# Patient Record
Sex: Male | Born: 1965 | Race: White | Hispanic: No | Marital: Married | Smoking: Never smoker
Health system: Southern US, Community
[De-identification: ages and names within clinical notes are randomized; demographics above are authoritative.]

## PROBLEM LIST (undated history)

## (undated) ENCOUNTER — Emergency Department: Admission: EM | Payer: Self-pay

## (undated) DIAGNOSIS — N411 Chronic prostatitis: Secondary | ICD-10-CM

## (undated) DIAGNOSIS — I10 Essential (primary) hypertension: Secondary | ICD-10-CM

## (undated) DIAGNOSIS — E039 Hypothyroidism, unspecified: Secondary | ICD-10-CM

## (undated) DIAGNOSIS — E781 Pure hyperglyceridemia: Secondary | ICD-10-CM

## (undated) DIAGNOSIS — E291 Testicular hypofunction: Secondary | ICD-10-CM

## (undated) HISTORY — DX: Chronic prostatitis: N41.1

## (undated) HISTORY — PX: APPENDECTOMY: SHX54

## (undated) HISTORY — DX: Pure hyperglyceridemia: E78.1

## (undated) HISTORY — PX: SHOULDER ARTHROSCOPY: SHX128

## (undated) HISTORY — DX: Essential (primary) hypertension: I10

## (undated) HISTORY — DX: Testicular hypofunction: E29.1

## (undated) HISTORY — DX: Hypothyroidism, unspecified: E03.9

## (undated) HISTORY — PX: TENDON REPAIR: SHX5111

---

## 2009-03-18 ENCOUNTER — Emergency Department (HOSPITAL_BASED_OUTPATIENT_CLINIC_OR_DEPARTMENT_OTHER): Admission: EM | Admit: 2009-03-18 | Discharge: 2009-03-18 | Payer: Self-pay | Admitting: Emergency Medicine

## 2009-03-18 ENCOUNTER — Ambulatory Visit: Payer: Self-pay | Admitting: Interventional Radiology

## 2010-02-27 ENCOUNTER — Ambulatory Visit: Payer: Self-pay | Admitting: Family Medicine

## 2010-03-01 ENCOUNTER — Emergency Department (HOSPITAL_BASED_OUTPATIENT_CLINIC_OR_DEPARTMENT_OTHER): Admission: EM | Admit: 2010-03-01 | Discharge: 2010-03-01 | Payer: Self-pay | Admitting: Emergency Medicine

## 2010-03-08 ENCOUNTER — Ambulatory Visit (HOSPITAL_COMMUNITY): Admission: RE | Admit: 2010-03-08 | Discharge: 2010-03-09 | Payer: Self-pay | Admitting: Orthopedic Surgery

## 2010-10-18 NOTE — Assessment & Plan Note (Signed)
Summary: NASAL INFEC?/TM   Vital Signs:  Patient Profile:   45 Years Old Male CC:      Infection in nose x 3 days, left ankle sprain x 1 week Height:     72 inches Weight:      210 pounds O2 Sat:      99 % O2 treatment:    Room Air Temp:     98.7 degrees F oral Pulse rate:   110 / minute Pulse rhythm:   regular Resp:     16 per minute BP sitting:   155 / 94  (right arm) Cuff size:   large  Vitals Entered By: Emilio Math (February 27, 2010 2:20 PM)                  Current Allergies: ! DEMEROLHistory of Present Illness Chief Complaint: Infection in nose x 3 days, left ankle sprain x 1 week History of Present Illness: Subjective:  Patient complains of 3 day history of swelling, tenderness, and redness at tip of nose.  He states that he gets about one infection in his nose per year, and this one is somewhat worse than usual.  No facial swelling or fever. He also has a history of seasonal allergies, and over the past two or three weeks has developed persistent sinus congestion.  Ears feel clogged but not painful.  No sore throat. He also reports that he experienced a mild left ankle sprain about a week ago and has persistent mild lateral ankle tenderness but declines evaluation for this problem.  He wonders how long it takes for an ankle sprain to heal.  REVIEW OF SYSTEMS Constitutional Symptoms      Denies fever, chills, night sweats, weight loss, weight gain, and fatigue.  Eyes       Denies change in vision, eye pain, eye discharge, glasses, contact lenses, and eye surgery. Ear/Nose/Throat/Mouth       Denies hearing loss/aids, change in hearing, ear pain, ear discharge, dizziness, frequent runny nose, frequent nose bleeds, sinus problems, sore throat, hoarseness, and tooth pain or bleeding.  Respiratory       Denies dry cough, productive cough, wheezing, shortness of breath, asthma, bronchitis, and emphysema/COPD.  Cardiovascular       Denies murmurs, chest pain, and tires  easily with exhertion.    Gastrointestinal       Denies stomach pain, nausea/vomiting, diarrhea, constipation, blood in bowel movements, and indigestion. Genitourniary       Denies painful urination, kidney stones, and loss of urinary control. Neurological       Denies paralysis, seizures, and fainting/blackouts. Musculoskeletal       Denies muscle pain, joint pain, joint stiffness, decreased range of motion, redness, swelling, muscle weakness, and gout.  Skin       Denies bruising, unusual mles/lumps or sores, and hair/skin or nail changes.  Psych       Denies mood changes, temper/anger issues, anxiety/stress, speech problems, depression, and sleep problems.  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  Extraocular movement is intact.  Conjunctivae are not inflamed.  Ears:  Canals normal.  Tympanic membranes normal.   Nose:  Erythematous distally and mildly tender but not  swollen.  Turbinates are mildly congested.  There is mild maxillary sinus tenderness Pharynx:  Normal  Neck:  Supple.  No adenopathy is present.  No thyromegaly is present  Assessment New Problems: ANKLE SPRAIN, LEFT (ICD-845.00) ACUTE MAXILLARY SINUSITIS (ICD-461.0) CELLULITIS AND ABSCESS OF FACE (ICD-682.0)   Plan New Medications/Changes: FLONASE 50 MCG/ACT SUSP (FLUTICASONE PROPIONATE) 2 sprays in each nostril once daily.  #1 x 1, 02/27/2010, Donna Christen MD FLUTICASONE PROPIONATE 50 MCG/ACT SUSP (FLUTICASONE PROPIONATE) 2 sprays in each nostril once daily  #One x 1, 02/27/2010, Donna Christen MD MUPIROCIN 2 % OINT (MUPIROCIN) Apply to nares three times a day  #1 tube x 0,  02/27/2010, Donna Christen MD SULFAMETHOXAZOLE-TMP DS 800-160 MG TABS (SULFAMETHOXAZOLE-TRIMETHOPRIM) Two by mouth two times a day  #40 x 0, 02/27/2010, Donna Christen MD  New Orders: New Patient Level III [phone] Planning Comments:   Begin warm compresses to nose several times daily.  Begin Bactrim DS, two tabs bid.   Begin expectorant/decongestant.  Apply Bactroban to nares two or three times daily. Wear ankle brace for 2 to 3 weeks until ankle healed.  Begin ankle exercises ([facility] information and instruction patient handout given)  Follow-up with ENT if nose not improved about 48 hours.   The patient and/or caregiver has been counseled thoroughly with regard to medications prescribed including dosage, schedule, interactions, rationale for use, and possible side effects and they verbalize understanding.  Diagnoses and expected course of recovery discussed and will return if not improved as expected or if the condition worsens. Patient and/or caregiver verbalized understanding.  Prescriptions: FLONASE 50 MCG/ACT SUSP (FLUTICASONE PROPIONATE) 2 sprays in each nostril once daily.  #1 x 1   Entered and Authorized by:   Donna Christen MD   Signed by:   Donna Christen MD on 02/27/2010   Method used:   Print then Give to Patient   RxID:   [id number] FLUTICASONE PROPIONATE 50 MCG/ACT SUSP (FLUTICASONE PROPIONATE) 2 sprays in each nostril once daily  #One x 1   Entered and Authorized by:   Donna Christen MD   Signed by:   Donna Christen MD on 02/27/2010   Method used:   Print then Give to Patient   RxID:   [id number] MUPIROCIN 2 % OINT (MUPIROCIN) Apply to nares three times a day  #1 tube x 0   Entered and Authorized by:   Donna Christen MD   Signed by:   Donna Christen MD on 02/27/2010   Method used:   Print then Give to Patient   RxID:   [id number] SULFAMETHOXAZOLE-TMP DS 800-160 MG TABS (SULFAMETHOXAZOLE-TRIMETHOPRIM) Two by mouth two times a day  #40 x 0   Entered and  Authorized by:   Donna Christen MD   Signed by:   Donna Christen MD on 02/27/2010   Method used:   Print then Give to Patient   RxID:   [id number]   Patient Instructions: 1)  May use Mucinex D (guaifenesin with decongestant) twice daily for congestion. 2)  Increase fluid intake, rest. 3)  May use Afrin nasal spray (or generic oxymetazoline) twice daily for about 5 days (about 15 minutes before using fluticasone spray).   Also recommend using saline nasal spray several times daily and/or saline nasal irrigation. 4)  Apply warm compresses to nose 3 or 4 times daily. 5)  Followup with ENT doctor if not improved 2 to 3 days.  Orders Added: 1)  New Patient Level III [63875]

## 2010-12-04 LAB — DIFFERENTIAL
Basophils Absolute: 0 10*3/uL (ref 0.0–0.1)
Basophils Relative: 0 % (ref 0–1)
Eosinophils Relative: 1 % (ref 0–5)
Monocytes Absolute: 0.4 10*3/uL (ref 0.1–1.0)
Neutro Abs: 4.5 10*3/uL (ref 1.7–7.7)

## 2010-12-04 LAB — BASIC METABOLIC PANEL
BUN: 14 mg/dL (ref 6–23)
CO2: 27 mEq/L (ref 19–32)
Calcium: 9.3 mg/dL (ref 8.4–10.5)
Glucose, Bld: 90 mg/dL (ref 70–99)
Sodium: 139 mEq/L (ref 135–145)

## 2010-12-04 LAB — URINALYSIS, ROUTINE W REFLEX MICROSCOPIC
Bilirubin Urine: NEGATIVE
Glucose, UA: NEGATIVE mg/dL
Ketones, ur: NEGATIVE mg/dL
pH: 6 (ref 5.0–8.0)

## 2010-12-04 LAB — CBC
HCT: 41.8 % (ref 39.0–52.0)
Hemoglobin: 14.7 g/dL (ref 13.0–17.0)
MCHC: 35.2 g/dL (ref 30.0–36.0)
Platelets: 230 10*3/uL (ref 150–400)
RDW: 11.5 % (ref 11.5–15.5)

## 2010-12-04 LAB — PROTIME-INR: Prothrombin Time: 12.2 seconds (ref 11.6–15.2)

## 2014-08-04 ENCOUNTER — Encounter: Payer: Self-pay | Admitting: Family Medicine

## 2014-08-04 ENCOUNTER — Ambulatory Visit (INDEPENDENT_AMBULATORY_CARE_PROVIDER_SITE_OTHER): Payer: BC Managed Care – PPO | Admitting: Family Medicine

## 2014-08-04 VITALS — BP 136/90 | HR 78 | Ht 72.0 in | Wt 223.0 lb

## 2014-08-04 DIAGNOSIS — E781 Pure hyperglyceridemia: Secondary | ICD-10-CM

## 2014-08-04 DIAGNOSIS — E291 Testicular hypofunction: Secondary | ICD-10-CM | POA: Diagnosis not present

## 2014-08-04 DIAGNOSIS — J329 Chronic sinusitis, unspecified: Secondary | ICD-10-CM

## 2014-08-04 DIAGNOSIS — A499 Bacterial infection, unspecified: Secondary | ICD-10-CM

## 2014-08-04 DIAGNOSIS — E038 Other specified hypothyroidism: Secondary | ICD-10-CM | POA: Diagnosis not present

## 2014-08-04 DIAGNOSIS — N410 Acute prostatitis: Secondary | ICD-10-CM

## 2014-08-04 DIAGNOSIS — E0590 Thyrotoxicosis, unspecified without thyrotoxic crisis or storm: Secondary | ICD-10-CM | POA: Insufficient documentation

## 2014-08-04 DIAGNOSIS — R8299 Other abnormal findings in urine: Secondary | ICD-10-CM | POA: Diagnosis not present

## 2014-08-04 DIAGNOSIS — B9689 Other specified bacterial agents as the cause of diseases classified elsewhere: Secondary | ICD-10-CM

## 2014-08-04 DIAGNOSIS — E039 Hypothyroidism, unspecified: Secondary | ICD-10-CM

## 2014-08-04 DIAGNOSIS — R8290 Unspecified abnormal findings in urine: Secondary | ICD-10-CM

## 2014-08-04 DIAGNOSIS — N411 Chronic prostatitis: Secondary | ICD-10-CM

## 2014-08-04 DIAGNOSIS — R7989 Other specified abnormal findings of blood chemistry: Secondary | ICD-10-CM | POA: Insufficient documentation

## 2014-08-04 HISTORY — DX: Hypothyroidism, unspecified: E03.9

## 2014-08-04 HISTORY — DX: Testicular hypofunction: E29.1

## 2014-08-04 HISTORY — DX: Pure hyperglyceridemia: E78.1

## 2014-08-04 HISTORY — DX: Chronic prostatitis: N41.1

## 2014-08-04 LAB — POCT URINALYSIS DIPSTICK
Bilirubin, UA: NEGATIVE
Glucose, UA: NEGATIVE
Ketones, UA: NEGATIVE
NITRITE UA: NEGATIVE
PH UA: 6.5
SPEC GRAV UA: 1.025
UROBILINOGEN UA: 0.2

## 2014-08-04 MED ORDER — SULFAMETHOXAZOLE-TRIMETHOPRIM 800-160 MG PO TABS: ORAL_TABLET | ORAL | Status: AC

## 2014-08-04 MED ORDER — TRIAMCINOLONE ACETONIDE 55 MCG/ACT NA AERO: 2.0000 | INHALATION_SPRAY | Freq: Every day | NASAL | Status: DC

## 2014-08-04 NOTE — Progress Notes (Signed)
CC: Everrett Coombehillip B Salameh is a 48 y.o. male is here for Establish Care; Cough; and f/u UTI   Subjective: HPI:  Very pleasant 48 year old here to establish care  Patient complains of mild dysuria and cloudy appearance to the urine. This has been present since September and was moderate in severity prior to beginning 2 weeks of ciprofloxacin. Before starting this medication he had a urine culture that showed a beta-hemolytic strep infection. He reports a Cipro has helped a little bit but not fully resolved his symptoms. Since being on Cipro he reports diffuse tendon discomfort that has been improving since stopping Cipro. There has been no flank pain or visual blood in his urine.he tells me he has a history of chronic prostatitis stemming back into his teenage years and is currently seeing urology for management of this  Reports a history of hypertriglyceridemia most recently checked over a year ago with a level of 600. He is not currently taking any medication to help with this.  Complains ofnasal congestion and postnasal drip with Rusher in the face localized beneath both eyes that has been present for a little over 1 week on a daily basis worsening on a daily basis now moderate in severity. For the past week he's tried Nasacort without much benefit. He's also tried Allegra-D however this is causing urinary retention without much benefit for the above complaints in this paragraph.  He reports that in 2013 he had a TSH of 5.3 that was never followed up on.  He reports a history of hypogonadism with a testosterone that was checked about a year ago that was 300.  Review of Systems - General ROS: negative for - chills, fever, night sweats, weight gain or weight loss Ophthalmic ROS: negative for - decreased vision Psychological ROS: negative for - anxiety or depression ENT ROS: negative for - hearing change, tinnitus or allergies Hematological and Lymphatic ROS: negative for - bleeding problems, bruising  or swollen lymph nodes Breast ROS: negative Respiratory ROS: no cough, shortness of breath, or wheezing Cardiovascular ROS: no chest pain or dyspnea on exertion Gastrointestinal ROS: no abdominal pain, change in bowel habits, or black or bloody stools Genito-Urinary ROS: negative for - genital discharge, genital ulcers, incontinence or abnormal bleeding from genitals Musculoskeletal ROS: negative for - joint pain or muscle pain other than that described above Neurological ROS: negative for - headaches or memory loss Dermatological ROS: negative for lumps, mole changes, rash and skin lesion changes  Past Medical History  Diagnosis Date  . Hypertriglyceridemia 08/04/2014  . Hypogonadism in male 08/04/2014  . Hypothyroidism 08/04/2014  . Chronic prostatitis 08/04/2014    No past surgical history on file. No family history on file.  History   Social History  . Marital Status: Married    Spouse Name: N/A    Number of Children: N/A  . Years of Education: N/A   Occupational History  . Not on file.   Social History Main Topics  . Smoking status: Not on file  . Smokeless tobacco: Not on file  . Alcohol Use: Not on file  . Drug Use: Not on file  . Sexual Activity: Not on file   Other Topics Concern  . Not on file   Social History Narrative  . No narrative on file     Objective: BP 136/90 mmHg  Pulse 78  Ht 6' (1.829 m)  Wt 223 lb (101.152 kg)  BMI 30.24 kg/m2  General: Alert and Oriented, No Acute Distress HEENT: Pupils  equal, round, reactive to light. Conjunctivae clear.  External ears unremarkable, canals clear with intact TMs with appropriate landmarks.  Middle ear appears open without effusion. Pink inferior turbinates.  Moist mucous membranes, pharynx without inflammation nor lesions however moderate postnasal drip.  Neck supple without palpable lymphadenopathy nor abnormal masses. Lungs: Clear to auscultation bilaterally, no wheezing/ronchi/rales.  Comfortable work of  breathing. Good air movement. Cardiac: Regular rate and rhythm. Normal S1/S2.  No murmurs, rubs, nor gallops.   Extremities: No peripheral edema.  Strong peripheral pulses.  Mental Status: No depression, anxiety, nor agitation. Skin: Warm and dry.  Assessment & Plan: [name] was seen today for establish care, cough and f/u uti.  Diagnoses and associated orders for this visit:  Cloudy urine - Urinalysis Dipstick  Hypertriglyceridemia  Other specified hypothyroidism  Hypogonadism in male  Bacterial sinusitis - triamcinolone (NASACORT AQ) 55 MCG/ACT AERO nasal inhaler; Place 2 sprays into the nose daily.  Acute bacterial prostatitis - sulfamethoxazole-trimethoprim (SEPTRA DS) 800-160 MG per tablet; One by mouth twice a day for four weeks. - Urine culture  Chronic prostatitis    Hypertriglyceridemia: Discussed that we will have this addressed when he returns for a complete physical exam and fasting lipid panel Hypergonadism: Will recheck testosterone at his upcoming complete physical exam Hypothyroidism: Will recheck TSH at upcoming physical exam Bacterial sinusitis: Continue Nasacort, discussed that 75% confident his sinus infection should improve with Bactrim primarily prescribed for condition below Acute bacterial prostatitis: Start Bactrim targeting strep infection from September. Will obtain culture. I've chosen Bactrim for better prostate penetration as opposed to penicillin or Augmentin alone. Chronic prostatitis will continue to be managed by urology  Return for Within the next four weeks for a complete physical exam..

## 2014-08-06 LAB — URINE CULTURE: Colony Count: >=100000

## 2014-08-20 ENCOUNTER — Telehealth: Payer: Self-pay | Admitting: Family Medicine

## 2014-08-20 ENCOUNTER — Encounter: Payer: Self-pay | Admitting: Family Medicine

## 2014-08-20 ENCOUNTER — Ambulatory Visit (INDEPENDENT_AMBULATORY_CARE_PROVIDER_SITE_OTHER): Payer: BC Managed Care – PPO | Admitting: Family Medicine

## 2014-08-20 VITALS — BP 109/67 | HR 75 | Ht 72.0 in | Wt 222.0 lb

## 2014-08-20 DIAGNOSIS — Z0000 Encounter for general adult medical examination without abnormal findings: Secondary | ICD-10-CM

## 2014-08-20 DIAGNOSIS — E291 Testicular hypofunction: Secondary | ICD-10-CM

## 2014-08-20 DIAGNOSIS — E038 Other specified hypothyroidism: Secondary | ICD-10-CM | POA: Diagnosis not present

## 2014-08-20 DIAGNOSIS — E781 Pure hyperglyceridemia: Secondary | ICD-10-CM | POA: Diagnosis not present

## 2014-08-20 LAB — COMPLETE METABOLIC PANEL WITH GFR
ALT: 29 U/L (ref 0–53)
AST: 22 U/L (ref 0–37)
Albumin: 4.4 g/dL (ref 3.5–5.2)
Alkaline Phosphatase: 58 U/L (ref 39–117)
BILIRUBIN TOTAL: 0.6 mg/dL (ref 0.2–1.2)
BUN: 18 mg/dL (ref 6–23)
CALCIUM: 9.6 mg/dL (ref 8.4–10.5)
CHLORIDE: 101 meq/L (ref 96–112)
CO2: 29 meq/L (ref 19–32)
CREATININE: 0.95 mg/dL (ref 0.50–1.35)
GFR, Est Non African American: >89 mL/min
Glucose, Bld: 83 mg/dL (ref 70–99)
Potassium: 4.6 mEq/L (ref 3.5–5.3)
Sodium: 136 mEq/L (ref 135–145)
Total Protein: 7.1 g/dL (ref 6.0–8.3)

## 2014-08-20 LAB — LIPID PANEL
CHOL/HDL RATIO: 4.9 ratio
Cholesterol: 185 mg/dL (ref 0–200)
HDL: 38 mg/dL — AB (ref >39)
TRIGLYCERIDES: 413 mg/dL — AB (ref <150)

## 2014-08-20 LAB — TSH: TSH: 3.771 u[IU]/mL (ref 0.350–4.500)

## 2014-08-20 NOTE — Progress Notes (Signed)
CC: Casey Elliott is a 48 y.o. male is here for Annual Exam   Subjective: HPI:  Colonoscopy: For reasons that are unknown to him he had a colonoscopy sometime around the age of 48. He believes that polyps were found but he is not sure if they were biopsied. He denies any family history of colon cancer Prostate: Discussed screening risks/beneifts with patient during today's visit, he sees a urologist regularly for chronic prostatitis O PSA today  Influenza Vaccine: Up-to-date this season Pneumovax: No current indication Td/Tdap: Tetanus booster in 2010, up-to-date Zoster: (Start 48 yo)  No acute complaints today, his dysuria and discolored urine has now completely resolved since being on Bactrim. He denies any genitourinary complaints and was having some pain with orgasm however this resolved after starting Bactrim as well.  Rare alcohol use no tobacco or recreational drug use  Review of Systems - General ROS: negative for - chills, fever, night sweats, weight gain or weight loss Ophthalmic ROS: negative for - decreased vision Psychological ROS: negative for - anxiety or depression ENT ROS: negative for - hearing change,  tinnitus or allergies, positive for nasal congestion Hematological and Lymphatic ROS: negative for - bleeding problems, bruising or swollen lymph nodes Breast ROS: negative Respiratory ROS: no cough, shortness of breath, or wheezing Cardiovascular ROS: no chest pain or dyspnea on exertion Gastrointestinal ROS: no abdominal pain, change in bowel habits, or black or bloody stools Genito-Urinary ROS: negative for - genital discharge, genital ulcers, incontinence or abnormal bleeding from genitals Musculoskeletal ROS: negative for - joint pain or muscle pain Neurological ROS: negative for - headaches or memory loss Dermatological ROS: negative for lumps, mole changes, rash and skin lesion changes  Past Medical History  Diagnosis Date  . Hypertriglyceridemia  08/04/2014  . Hypogonadism in male 08/04/2014  . Hypothyroidism 08/04/2014  . Chronic prostatitis 08/04/2014    No past surgical history on file. No family history on file.  History   Social History  . Marital Status: Married    Spouse Name: N/A    Number of Children: N/A  . Years of Education: N/A   Occupational History  . Not on file.   Social History Main Topics  . Smoking status: Never Smoker   . Smokeless tobacco: Not on file  . Alcohol Use: Not on file  . Drug Use: Not on file  . Sexual Activity: Not on file   Other Topics Concern  . Not on file   Social History Narrative     Objective: BP 109/67 mmHg  Pulse 75  Ht 6' (1.829 m)  Wt 222 lb (100.699 kg)  BMI 30.10 kg/m2  SpO2 99%  General: No Acute Distress HEENT: Atraumatic, normocephalic, conjunctivae normal without scleral icterus.  No nasal discharge, hearing grossly intact, TMs with good landmarks bilaterally with no middle ear abnormalities, posterior pharynx clear without oral lesions. Neck: Supple, trachea midline, no cervical nor supraclavicular adenopathy. Pulmonary: Clear to auscultation bilaterally without wheezing, rhonchi, nor rales. Cardiac: Regular rate and rhythm.  No murmurs, rubs, nor gallops. No peripheral edema.  2+ peripheral pulses bilaterally. Abdomen: Bowel sounds normal.  No masses.  Non-tender without rebound.  Negative Murphy's sign. GU: Bilateral descended testes no inguinal hernias MSK: Grossly intact, no signs of weakness.  Full strength throughout upper and lower extremities.  Full ROM in upper and lower extremities.  No midline spinal tenderness. Neuro: Gait unremarkable, CN II-XII grossly intact.  C5-C6 Reflex 2/4 Bilaterally, L4 Reflex 2/4 Bilaterally.  Cerebellar function  intact. Skin: No rashes. Noninflamed skin tag on the left trunk underneath the armpit and right trunk underneath the armpit Psych: Alert and oriented to person/place/time.  Thought process normal. No  anxiety/depression.  Assessment & Plan: Casey Elliott was seen today for annual exam.  Diagnoses and associated orders for this visit:  Hypertriglyceridemia  Hypogonadism in male  Other specified hypothyroidism  Annual physical exam - TSH - Lipid panel - Testosterone - COMPLETE METABOLIC PANEL WITH GFR    We will request records from digestive health specialist for further clarification of why he had a colonoscopy and when his next colonoscopy is due.  Healthy lifestyle interventions including but not limited to regular exercise, a healthy low fat diet, moderation of salt intake, the dangers of tobacco/alcohol/recreational drug use, nutrition supplementation, and accident avoidance were discussed with the patient and a handout was provided for future reference.   Return if symptoms worsen or fail to improve.

## 2014-08-20 NOTE — Patient Instructions (Signed)
Dr. Kaemon Barnett's General Advice Following Your Complete Physical Exam  The Benefits of Regular Exercise: Unless you suffer from an uncontrolled cardiovascular condition, studies strongly suggest that regular exercise and physical activity will add to both the quality and length of your life.  The World Health Organization recommends 150 minutes of moderate intensity aerobic activity every week.  This is best split over 3-4 days a week, and can be as simple as a brisk walk for just over 35 minutes "most days of the week".  This type of exercise has been shown to lower LDL-Cholesterol, lower average blood sugars, lower blood pressure, lower cardiovascular disease risk, improve memory, and increase one's overall sense of wellbeing.  The addition of anaerobic (or "strength training") exercises offers additional benefits including but not limited to increased metabolism, prevention of osteoporosis, and improved overall cholesterol levels.  How Can I Strive For A Low-Fat Diet?: Current guidelines recommend that 25-35 percent of your daily energy (food) intake should come from fats.  One might ask how can this be achieved without having to dissect each meal on a daily basis?  Switch to skim or 1% milk instead of whole milk.  Focus on lean meats such as ground turkey, fresh fish, baked chicken, and lean cuts of beef as your source of dietary protein.  Limit saturated fat consumption to less than 10% of your daily caloric intake.  Limit trans fatty acid consumption primarily by limiting synthetic trans fats such as partially hydrogenated oils (Ex: fried fast foods).  Substitute olive or vegetable oil for solid fats where possible.  Moderation of Salt Intake: Provided you don't carry a diagnosis of congestive heart failure nor renal failure, I recommend a daily allowance of no more than 2300 mg of salt (sodium).  Keeping under this daily goal is associated with a decreased risk of cardiovascular events, creeping  above it can lead to elevated blood pressures and increases your risk of cardiovascular events.  Milligrams (mg) of salt is listed on all nutrition labels, and your daily intake can add up faster than you think.  Most canned and frozen dinners can pack in over half your daily salt allowance in one meal.    Lifestyle Health Risks: Certain lifestyle choices carry specific health risks.  As you may already know, tobacco use has been associated with increasing one's risk of cardiovascular disease, pulmonary disease, numerous cancers, among many other issues.  What you may not know is that there are medications and nicotine replacement strategies that can more than double your chances of successfully quitting.  I would be thrilled to help manage your quitting strategy if you currently use tobacco products.  When it comes to alcohol use, I've yet to find an "ideal" daily allowance.  Provided an individual does not have a medical condition that is exacerbated by alcohol consumption, general guidelines determine "safe drinking" as no more than two standard drinks for a man or no more than one standard drink for a male per day.  However, much debate still exists on whether any amount of alcohol consumption is technically "safe".  My general advice, keep alcohol consumption to a minimum for general health promotion.  If you or others believe that alcohol, tobacco, or recreational drug use is interfering with your life, I would be happy to provide confidential counseling regarding treatment options.  General "Over The Counter" Nutrition Advice: Postmenopausal women should aim for a daily calcium intake of 1200 mg, however a significant portion of this might already be   provided by diets including milk, yogurt, cheese, and other dairy products.  Vitamin D has been shown to help preserve bone density, prevent fatigue, and has even been shown to help reduce falls in the elderly.  Ensuring a daily intake of 800 Units of  Vitamin D is a good place to start to enjoy the above benefits, we can easily check your Vitamin D level to see if you'd potentially benefit from supplementation beyond 800 Units a day.  Folic Acid intake should be of particular concern to women of childbearing age.  Daily consumption of 400-800 mcg of Folic Acid is recommended to minimize the chance of spinal cord defects in a fetus should pregnancy occur.    For many adults, accidents still remain one of the most common culprits when it comes to cause of death.  Some of the simplest but most effective preventitive habits you can adopt include regular seatbelt use, proper helmet use, securing firearms, and regularly testing your smoke and carbon monoxide detectors.  Casey Spain B. Diera Wirkkala DO [facility] [address], [address] [place], [place] [address] Phone: [phone]  

## 2014-08-20 NOTE — Telephone Encounter (Signed)
Casey Elliott, CAn you please see if [facility] can send us any results about colonoscopy or EGD for this patient in the last 10 years?

## 2014-08-21 ENCOUNTER — Telehealth: Payer: Self-pay | Admitting: Family Medicine

## 2014-08-21 DIAGNOSIS — E781 Pure hyperglyceridemia: Secondary | ICD-10-CM

## 2014-08-21 LAB — TESTOSTERONE: TESTOSTERONE: 268 ng/dL — AB (ref 300–890)

## 2014-08-21 MED ORDER — ICOSAPENT ETHYL 1 G PO CAPS: 2.0000 | ORAL_CAPSULE | Freq: Two times a day (BID) | ORAL | Status: DC

## 2014-08-21 NOTE — Telephone Encounter (Signed)
Asher MuirJamie, Will you please let patient know that his thyroid function is normal.  Testosterone was low as predicted, if he's interested in starting testosterone supplementation therapy then we'll need to check a PSA and hemoglobin first.  The PSA will be influenced by his resolving prostate infection so I'd advise waiting to have this checked until late December. Triglycerides were elevated as expected, 413 to be exact.  I'd recommend starting a purified fish oil product calld Vascepa. I've sent an Rx to [facility] but would advise him to pick up a savings card to cut down the cost (in your/Andrea's inbox).  Call later in Dec for lab slips.

## 2014-08-21 NOTE — Telephone Encounter (Signed)
Patient notifed and will stop in office to pick up savings card. Corliss SkainsJamie Welcome Fults, CMA

## 2014-08-27 NOTE — Telephone Encounter (Signed)
[phone]. Called [facility] and they will fax it over

## 2014-08-28 NOTE — Telephone Encounter (Signed)
Called again and they said they would fax over. Only have an EGD report from 2012

## 2014-09-02 ENCOUNTER — Encounter: Payer: Self-pay | Admitting: Family Medicine

## 2014-09-23 ENCOUNTER — Encounter: Payer: Self-pay | Admitting: Family Medicine

## 2014-09-23 ENCOUNTER — Ambulatory Visit (INDEPENDENT_AMBULATORY_CARE_PROVIDER_SITE_OTHER): Payer: BLUE CROSS/BLUE SHIELD | Admitting: Family Medicine

## 2014-09-23 VITALS — BP 140/93 | HR 72 | Wt 227.0 lb

## 2014-09-23 DIAGNOSIS — N390 Urinary tract infection, site not specified: Secondary | ICD-10-CM | POA: Diagnosis not present

## 2014-09-23 DIAGNOSIS — Z79899 Other long term (current) drug therapy: Secondary | ICD-10-CM

## 2014-09-23 DIAGNOSIS — E781 Pure hyperglyceridemia: Secondary | ICD-10-CM | POA: Diagnosis not present

## 2014-09-23 DIAGNOSIS — E291 Testicular hypofunction: Secondary | ICD-10-CM | POA: Diagnosis not present

## 2014-09-23 DIAGNOSIS — R8271 Bacteriuria: Secondary | ICD-10-CM

## 2014-09-23 MED ORDER — ANASTROZOLE 1 MG PO TABS
1.0000 mg | ORAL_TABLET | Freq: Every day | ORAL | Status: DC
Start: 2014-09-23 — End: 2016-05-23

## 2014-09-23 NOTE — Progress Notes (Signed)
CC: Casey Elliott is a 49 y.o. male is here for f/u testosterone labs   Subjective: HPI:  Follow-up hypertriglyceridemia: He has now been taking vascepa for 2-3 months on a daily basis. He denies any known side effects or intolerance. He has not had any right upper quadrant pain nor epigastric discomfort recently or remotely. Beginning on a new workout regimen where he would be working out 4 days a week no longer playing hockey but focusing more on his overall physical fitness  Follow-up chronic prostatitis: He states that he's no longer having any urinary hesitancy or burning when he P's. No longer cloudy appearance to his urine. He is no longer awakening at night to urinate. Denies discharge or any genitourinary complaints but wants to know if we can confirm that the infection is clear.  Follow-up hypogonadism: He tells me that he is apprehensive about taking testosterone replacement therapy and would prefer to focus on improving endogenous testosterone. He's been doing some research and wants to start on Clomid. States his libido is absolutely gone at this point.   Review Of Systems Outlined In HPI  Past Medical History  Diagnosis Date  . Hypertriglyceridemia 08/04/2014  . Hypogonadism in male 08/04/2014  . Hypothyroidism 08/04/2014  . Chronic prostatitis 08/04/2014    No past surgical history on file. No family history on file.  History   Social History  . Marital Status: Married    Spouse Name: N/A    Number of Children: N/A  . Years of Education: N/A   Occupational History  . Not on file.   Social History Main Topics  . Smoking status: Never Smoker   . Smokeless tobacco: Not on file  . Alcohol Use: Not on file  . Drug Use: Not on file  . Sexual Activity: Not on file   Other Topics Concern  . Not on file   Social History Narrative     Objective: BP 140/93 mmHg  Pulse 72  Wt 227 lb (102.967 kg)  Vital signs reviewed. General: Alert and Oriented, No Acute  Distress HEENT: Pupils equal, round, reactive to light. Conjunctivae clear.  External ears unremarkable.  Moist mucous membranes. Lungs: Clear and comfortable work of breathing, speaking in full sentences without accessory muscle use. Cardiac: Regular rate and rhythm.  Neuro: CN II-XII grossly intact, gait normal. Extremities: No peripheral edema.  Strong peripheral pulses.  Mental Status: No depression, anxiety, nor agitation. Logical though process. Skin: Warm and dry.  Assessment & Plan: [name] was seen today for f/u testosterone labs.  Diagnoses and associated orders for this visit:  Hypertriglyceridemia - Lipid panel  Hypogonadism in male - anastrozole (ARIMIDEX) 1 MG tablet; Take 1 tablet (1 mg total) by mouth daily.  High risk medication use  Bacteria in urine - Urine culture    Hypertriglyceridemia: Due for repeat lipid panel continue vascepa pending results Hypogonadism: I let him know that I have no experience with Clomid and it's not that I don't want him to take it but that I'm not the right person to provide him guidance and management on this medication. I offered him an anastrozole to help reduce the breakdown of endogenous testosterone and I'm willing to help prevent breakdown but don't feel comfortable managing any medications that would stimulate testosterone production due to inexperience. Follow-up in 2-3 months to repeat testosterone and check for hypogonadism symptoms, discussed that working out on a regular basis will also stimulate testosterone production and reduce adipose tissue which is contributing to  the breakdown testosterone. Per his request urine culture will be obtained to ensure eradication of his UTI.  25 minutes spent face-to-face during visit today of which at least 50% was counseling or coordinating care regarding: 1. Hypertriglyceridemia   2. Hypogonadism in male   3. High risk medication use   4. Bacteria in urine       Return in about  3 months (around 12/23/2014).

## 2014-11-12 ENCOUNTER — Telehealth: Payer: Self-pay | Admitting: Family Medicine

## 2014-11-12 DIAGNOSIS — E291 Testicular hypofunction: Secondary | ICD-10-CM

## 2014-11-12 NOTE — Telephone Encounter (Signed)
Mr Casey Elliott made an appt. For 3/1 to discuss a referral to an Endocrinologist but wants to know if he has to have the appt or if Dr. Ivan Anchorshommel can just send the referral without an appt.

## 2014-11-13 NOTE — Telephone Encounter (Signed)
Pt notified via vm

## 2014-11-13 NOTE — Telephone Encounter (Signed)
Referral has been placed, it's up to him whether or not he wants to keep his appt

## 2014-11-17 ENCOUNTER — Encounter: Payer: Self-pay | Admitting: Family Medicine

## 2014-11-17 ENCOUNTER — Ambulatory Visit (INDEPENDENT_AMBULATORY_CARE_PROVIDER_SITE_OTHER): Payer: BLUE CROSS/BLUE SHIELD | Admitting: Family Medicine

## 2014-11-17 VITALS — BP 127/74 | HR 82 | Wt 225.0 lb

## 2014-11-17 DIAGNOSIS — G4700 Insomnia, unspecified: Secondary | ICD-10-CM

## 2014-11-17 DIAGNOSIS — A499 Bacterial infection, unspecified: Secondary | ICD-10-CM

## 2014-11-17 DIAGNOSIS — B9689 Other specified bacterial agents as the cause of diseases classified elsewhere: Secondary | ICD-10-CM

## 2014-11-17 DIAGNOSIS — E291 Testicular hypofunction: Secondary | ICD-10-CM | POA: Diagnosis not present

## 2014-11-17 DIAGNOSIS — L739 Follicular disorder, unspecified: Secondary | ICD-10-CM | POA: Diagnosis not present

## 2014-11-17 DIAGNOSIS — J329 Chronic sinusitis, unspecified: Secondary | ICD-10-CM

## 2014-11-17 DIAGNOSIS — T753XXS Motion sickness, sequela: Secondary | ICD-10-CM | POA: Diagnosis not present

## 2014-11-17 MED ORDER — MUPIROCIN 2 % EX OINT: TOPICAL_OINTMENT | CUTANEOUS | Status: DC

## 2014-11-17 MED ORDER — SCOPOLAMINE 1 MG/3DAYS TD PT72: 1.0000 | MEDICATED_PATCH | TRANSDERMAL | Status: DC

## 2014-11-17 MED ORDER — AMOXICILLIN-POT CLAVULANATE 500-125 MG PO TABS: ORAL_TABLET | ORAL | Status: AC

## 2014-11-17 MED ORDER — ALPRAZOLAM 0.5 MG PO TABS: 0.5000 mg | ORAL_TABLET | Freq: Every evening | ORAL | Status: DC | PRN

## 2014-11-17 NOTE — Progress Notes (Signed)
CC: Casey Elliott is a 49 y.o. male is here for Medication Refill   Subjective: HPI:  Follow-up hypogonadism: He still expresses interest in trying to avoid external testosterone supplementation but wants to increase his testosterone levels. He reports fatigue, decreased energy and low libido. This is been present for a matter of years now no benefit from anastrozole. Denies any known side effects from his medication. He has an appointment next Tuesday with Dr. Everardo All  He'll be going on a cruise in July. He is uncertain whether or not he has motion sickness issues. He's never been on a boat before.  Insomnia: He is requesting a refill on Xanax. He tells me that he uses this 1-2 times a week at the most for anxiety when trying to go to sleep. This has been present for a little over a year now. A single prescription usually lasting more than a year. He denies any difficulty with trying to stop this medication and has never needed on consecutive days. He denies any specific anxiety can be anything and everything that happened during the day or that he anticipates the next day.  Complains of facial pressure beneath the eyes and above the eyes that have been present for the past 3 weeks. Accompanied by thick nasal discharge and postnasal drip. Accompanied by an increased sensation of his chronic fatigue. No benefit from over-the-counter cough and cold medications. No shortness of breath wheezing or chest pain. Denies fevers  Complains of a painful hair on the back of his head. It's been present for the past week, interventions have included Bactroban ointment twice a day. He thinks it slightly getting better with this regimen that he's been using for at least 3 days now. He denies any skin changes elsewhere. No discharge from the site of his discomfort, it's mild in severity   Review Of Systems Outlined In HPI  Past Medical History  Diagnosis Date  . Hypertriglyceridemia 08/04/2014  .  Hypogonadism in male 08/04/2014  . Hypothyroidism 08/04/2014  . Chronic prostatitis 08/04/2014    No past surgical history on file. No family history on file.  History   Social History  . Marital Status: Married    Spouse Name: N/A  . Number of Children: N/A  . Years of Education: N/A   Occupational History  . Not on file.   Social History Main Topics  . Smoking status: Never Smoker   . Smokeless tobacco: Not on file  . Alcohol Use: Not on file  . Drug Use: Not on file  . Sexual Activity: Not on file   Other Topics Concern  . Not on file   Social History Narrative     Objective: BP 127/74 mmHg  Pulse 82  Wt 225 lb (102.059 kg)  General: Alert and Oriented, No Acute Distress HEENT: Pupils equal, round, reactive to light. Conjunctivae clear.  External ears unremarkable, canals clear with intact TMs with appropriate landmarks.  Middle ear appears open without effusion. Pink inferior turbinates.  Moist mucous membranes, pharynx without inflammation nor lesions other than mild cobblestoning and postnasal drip.  Neck supple without palpable lymphadenopathy nor abnormal masses. Lungs: Clear to auscultation bilaterally, no wheezing/ronchi/rales.  Comfortable work of breathing. Good air movement. Extremities: No peripheral edema.  Strong peripheral pulses.  Mental Status: No depression, anxiety, nor agitation. Skin: Warm and dry. Single follicle with folliculitis on the back of the left occiput without any discharge or induration  Assessment & Plan: Casey Elliott was seen today for medication  refill.  Diagnoses and all orders for this visit:  Folliculitis  Hypogonadism in male  Motion sickness, sequela Orders: -     scopolamine (TRANSDERM-SCOP) 1 MG/3DAYS; Place 1 patch (1.5 mg total) onto the skin every 3 (three) days.  Insomnia Orders: -     ALPRAZolam (XANAX) 0.5 MG tablet; Take 1 tablet (0.5 mg total) by mouth at bedtime as needed for anxiety.  Bacterial  sinusitis Orders: -     amoxicillin-clavulanate (AUGMENTIN) 500-125 MG per tablet; Take one by mouth every 8 hours for ten total days.  Other orders -     mupirocin ointment (BACTROBAN) 2 %; One application to folliculitis twice a day   folliculitis: Continue Bactroban application refills provided, Augmentin for his sinus infection should help with this as well Hypogonadism: Further management by endocrinology referral has been acquired, continue anastrozole pending Dr. George HughEllison's advice. Motion sickness: High potential for him to get seasick therefore given prescription for Transderm scopolamine to be used on an as-needed basis, urgency consider to start this the day he plans to board a ship Insomnia: Controlled with as needed use of sparingly used Xanax refills provided Spectral sinusitis: Start Augmentin   Return if symptoms worsen or fail to improve.

## 2014-11-24 ENCOUNTER — Ambulatory Visit (INDEPENDENT_AMBULATORY_CARE_PROVIDER_SITE_OTHER): Payer: BLUE CROSS/BLUE SHIELD | Admitting: Endocrinology

## 2014-11-24 ENCOUNTER — Encounter: Payer: Self-pay | Admitting: Endocrinology

## 2014-11-24 VITALS — BP 130/77 | HR 86 | Wt 222.0 lb

## 2014-11-24 DIAGNOSIS — E291 Testicular hypofunction: Secondary | ICD-10-CM | POA: Diagnosis not present

## 2014-11-24 DIAGNOSIS — N62 Hypertrophy of breast: Secondary | ICD-10-CM

## 2014-11-24 LAB — IBC PANEL
Iron: 98 ug/dL (ref 42–165)
Saturation Ratios: 28.7 % (ref 20.0–50.0)
Transferrin: 244 mg/dL (ref 212.0–360.0)

## 2014-11-24 NOTE — Patient Instructions (Signed)
blood tests are being requested for you today.  We'll let you know about the results. Based on the results, i'll be able to prescribe medication for you, such as "clomid." normalization of testosterone is not known to harm you.  however, there are "theoretical" risks, including increased fertility, hair loss, prostate cancer, benign prostate enlargement, blood clots, liver problems, lower hdl ("good cholesterol"), polycythemia (opposite of anemia), sleep apnea, and behavior changes.  Weight loss also helps increase the testosterone.

## 2014-11-24 NOTE — Progress Notes (Signed)
Subjective:    Patient ID: Casey Elliott, male    DOB: September 30, 1965, 49 y.o.   MRN: [id number]  HPI Pt reports he had puberty at the normal age.  He has 3 biological children.  He says he has never taken illicit androgens.  He has never been on any prescribed medication for hypogonadism.  He denies any h/o infertility.  He has never had surgery, or a serious injury to the head or genital area.   Pt states slight swelling at the bilat breast areas, and assoc fatigue.  He stopped arimidex after a short time, so he can't tell how well it works.   Past Medical History  Diagnosis Date  . Hypertriglyceridemia 08/04/2014  . Hypogonadism in male 08/04/2014  . Hypothyroidism 08/04/2014  . Chronic prostatitis 08/04/2014    Past Surgical History  Procedure Laterality Date  . Tendon repair    . Appendectomy    . Shoulder arthroscopy      History   Social History  . Marital Status: Married    Spouse Name: N/A  . Number of Children: N/A  . Years of Education: N/A   Occupational History  . Not on file.   Social History Main Topics  . Smoking status: Never Smoker   . Smokeless tobacco: Not on file  . Alcohol Use: Not on file  . Drug Use: Not on file  . Sexual Activity: Not on file   Other Topics Concern  . Not on file   Social History Narrative    Current Outpatient Prescriptions on File Prior to Visit  Medication Sig Dispense Refill  . ALPRAZolam (XANAX) 0.5 MG tablet Take 1 tablet (0.5 mg total) by mouth at bedtime as needed for anxiety. 30 tablet 0  . anastrozole (ARIMIDEX) 1 MG tablet Take 1 tablet (1 mg total) by mouth daily. 30 tablet 2  . mupirocin ointment (BACTROBAN) 2 % One application to folliculitis twice a day 22 g 0  . amoxicillin-clavulanate (AUGMENTIN) 500-125 MG per tablet Take one by mouth every 8 hours for ten total days. (Patient not taking: Reported on 11/24/2014) 30 tablet 0  . Icosapent Ethyl (VASCEPA) 1 G CAPS Take 2 capsules by mouth 2 (two) times daily  with a meal. (Patient not taking: Reported on 11/24/2014) 120 capsule 11  . scopolamine (TRANSDERM-SCOP) 1 MG/3DAYS Place 1 patch (1.5 mg total) onto the skin every 3 (three) days. (Patient not taking: Reported on 11/24/2014) 4 patch 0  . triamcinolone (NASACORT AQ) 55 MCG/ACT AERO nasal inhaler Place 2 sprays into the nose daily. (Patient not taking: Reported on 11/24/2014) 1 Inhaler 12   No current facility-administered medications on file prior to visit.    Allergies  Allergen Reactions  . Ciprofloxacin Other (See Comments)    Body aches  . Levofloxacin     Tendon Rupture  . Meperidine Hcl     Family History  Problem Relation Age of Onset  . Other Neg Hx     hypogonadism    BP 130/77 mmHg  Pulse 86  Wt 222 lb (100.699 kg)     Review of Systems denies depression, weight change, numbness, muscle weakness, fever, headache, easy bruising, sob, rash, blurry vision, chest pain.  He reports decreased libido, rhinorrhea, and ED sxs.  He has chronically decreased urinary stream.      Objective:   Physical Exam VS: see vs page GEN: no distress HEAD: head: no deformity eyes: no periorbital swelling, no proptosis external nose and ears are  normal mouth: no lesion seen NECK: supple, thyroid is not enlarged CHEST WALL: no deformity LUNGS: clear to auscultation BREASTS:  Slight bilat gynecomastia.   CV: reg rate and rhythm, no murmur ABD: abdomen is soft, nontender.  no hepatosplenomegaly.  not distended.  no hernia GENITALIA:  Normal male.   MUSCULOSKELETAL: muscle bulk and strength are grossly normal.  no obvious joint swelling.  gait is normal and steady.   EXTEMITIES: no deformity.  no edema PULSES: no carotid bruit NEURO:  cn 2-12 grossly intact.   readily moves all 4's.  sensation is intact to touch on all 4's. SKIN:  Normal texture and temperature.  No rash or suspicious lesion is visible.  Normal hair distribution. NODES:  None palpable at the neck.   PSYCH: alert,  well-oriented.  Does not appear anxious nor depressed.    Lab Results  Component Value Date   TESTOSTERONE 229.50* 11/24/2014       Assessment & Plan:  Hypogonadism, central, idiopathic, new Gynecomastia, possibly related to hypogonadism.  Id this doesn't improve with increasing the testosterone, we'll rx separately, prob with tamoxifen.  Patient is advised the following: Patient Instructions  blood tests are being requested for you today.  We'll let you know about the results. Based on the results, i'll be able to prescribe medication for you, such as "clomid." normalization of testosterone is not known to harm you.  however, there are "theoretical" risks, including increased fertility, hair loss, prostate cancer, benign prostate enlargement, blood clots, liver problems, lower hdl ("good cholesterol"), polycythemia (opposite of anemia), sleep apnea, and behavior changes.  Weight loss also helps increase the testosterone.       addendum: i have sent a prescription to your pharmacy, for clomid.

## 2014-11-25 LAB — PROLACTIN: PROLACTIN: 6.3 ng/mL (ref 2.1–17.1)

## 2014-11-25 LAB — TESTOSTERONE: Testosterone: 229.5 ng/dL — ABNORMAL LOW (ref 300.00–890.00)

## 2014-11-25 LAB — FOLLICLE STIMULATING HORMONE: FSH: 2.4 m[IU]/mL (ref 1.4–18.1)

## 2014-11-25 LAB — LUTEINIZING HORMONE: LH: 2.1 m[IU]/mL (ref 1.50–9.30)

## 2014-11-25 MED ORDER — CLOMIPHENE CITRATE 50 MG PO TABS: ORAL_TABLET | ORAL | Status: DC

## 2014-12-02 LAB — ESTRADIOL, FREE
ESTRADIOL FREE: 0.53 pg/mL — AB (ref <=0.45)
ESTRADIOL: 22 pg/mL (ref <=29)

## 2014-12-28 NOTE — Telephone Encounter (Signed)
close

## 2015-01-22 ENCOUNTER — Encounter: Payer: Self-pay | Admitting: Endocrinology

## 2015-01-22 ENCOUNTER — Other Ambulatory Visit: Payer: Self-pay | Admitting: Endocrinology

## 2015-01-22 DIAGNOSIS — E291 Testicular hypofunction: Secondary | ICD-10-CM

## 2015-01-25 ENCOUNTER — Encounter: Payer: Self-pay | Admitting: Family Medicine

## 2015-01-25 ENCOUNTER — Ambulatory Visit (INDEPENDENT_AMBULATORY_CARE_PROVIDER_SITE_OTHER): Payer: BLUE CROSS/BLUE SHIELD | Admitting: Family Medicine

## 2015-01-25 VITALS — BP 150/100 | HR 77 | Wt 223.0 lb

## 2015-01-25 DIAGNOSIS — I10 Essential (primary) hypertension: Secondary | ICD-10-CM

## 2015-01-25 DIAGNOSIS — R8299 Other abnormal findings in urine: Secondary | ICD-10-CM

## 2015-01-25 DIAGNOSIS — R002 Palpitations: Secondary | ICD-10-CM

## 2015-01-25 DIAGNOSIS — R8290 Unspecified abnormal findings in urine: Secondary | ICD-10-CM

## 2015-01-25 MED ORDER — ZOLPIDEM TARTRATE 5 MG PO TABS: 5.0000 mg | ORAL_TABLET | Freq: Every evening | ORAL | Status: DC | PRN

## 2015-01-25 NOTE — Progress Notes (Signed)
CC: Casey Elliott is a 49 y.o. male is here for Palpitations   Subjective: HPI:  Complains of palpitations over the past week that will last up to an hour. It usually occurs in the early afternoon. They're unpredictable. When they occur they're joined by shortness of breath described as a sensation that he can't get a full deep breath of air. He had symptoms when he was exercising and he had to stop what he was doing to rest due to skipping beats but no chest pain. Symptoms have not getting better or worse since onset 1 week ago. Nothing seems to make them better or worse. They happen at rest and with exertion. He's never had this before but he did have some chest pain in 2013 that eventually ended up with him getting a stress echo which was reportedly normal. No change in dietary habits other than eating out most nights of the week in April. He also tells me that hes been drinking homemade soup more often.  Denies chest pain, motor or sensory disturbances other than that described above. Denies fevers, chills, cough, wheezing, abdominal pain. Review of systems positive for anxiety. Review of systems positive for difficulty sleeping falling asleep and staying asleep, for the past few weeks he's had only 4-6 hours of sleep every night and wakes up feeling fatigued.  Denies any dysuria but is worried that Bactrim did not completely treat his urinary tract infection when I first met him. He would like his urine rechecked today.   Review Of Systems Outlined In HPI  Past Medical History  Diagnosis Date  . Hypertriglyceridemia 08/04/2014  . Hypogonadism in male 08/04/2014  . Hypothyroidism 08/04/2014  . Chronic prostatitis 08/04/2014    Past Surgical History  Procedure Laterality Date  . Tendon repair    . Appendectomy    . Shoulder arthroscopy     Family History  Problem Relation Age of Onset  . Other Neg Hx     hypogonadism    History   Social History  . Marital Status: Married     Spouse Name: N/A  . Number of Children: N/A  . Years of Education: N/A   Occupational History  . Not on file.   Social History Main Topics  . Smoking status: Never Smoker   . Smokeless tobacco: Not on file  . Alcohol Use: Not on file  . Drug Use: Not on file  . Sexual Activity: Not on file   Other Topics Concern  . Not on file   Social History Narrative     Objective: BP 150/100 mmHg  Pulse 77  Wt 223 lb (101.152 kg)  General: Alert and Oriented, No Acute Distress HEENT: Pupils equal, round, reactive to light. Conjunctivae clear. Moist mucous membranes Lungs: Clear to auscultation bilaterally, no wheezing/ronchi/rales.  Comfortable work of breathing. Good air movement. Cardiac: Regular rate and rhythm. Normal S1/S2.  No murmurs, rubs, nor gallops.   Extremities: No peripheral edema.  Strong peripheral pulses.  Mental Status: No depression, anxiety, nor agitation. Skin: Warm and dry.  Assessment & Plan: Casey Elliott was seen today for palpitations.  Diagnoses and all orders for this visit:  Cloudy urine Orders: -     Urine culture  Palpitations Orders: -     Cancel: Holter monitor - 48 hour; Future -     Cancel: Echocardiogram; Future -     Echocardiogram -     Holter monitor - 48 hour  Essential hypertension  Other orders -  zolpidem (AMBIEN) 5 MG tablet; Take 1 tablet (5 mg total) by mouth at bedtime as needed for sleep.   Essential hypertension: Advised to cut back on sodium drastically and to avoid soups and other high sodium containing foods. 5 days after this cutback come in for a nurse visit to recheck blood pressure. Palpitations: Obtaining echocardiogram and Holter monitor, this could be due to lack of sleep therefore focusing on insomnia with one-week worth of Ambien. Urine culture per his request  25 minutes spent face-to-face during visit today of which at least 50% was counseling or coordinating care regarding: 1. Cloudy urine   2. Palpitations    3. Essential hypertension      Return if symptoms worsen or fail to improve.

## 2015-01-27 LAB — URINE CULTURE
COLONY COUNT: NO GROWTH
ORGANISM ID, BACTERIA: NO GROWTH

## 2015-01-29 NOTE — Addendum Note (Signed)
Addended by: Chalmers CaterUTTLE, Rachyl Wuebker H on: 01/29/2015 11:04 AM   Modules accepted: Orders

## 2015-02-02 ENCOUNTER — Encounter: Payer: Self-pay | Admitting: *Deleted

## 2015-02-02 ENCOUNTER — Ambulatory Visit (INDEPENDENT_AMBULATORY_CARE_PROVIDER_SITE_OTHER): Payer: BLUE CROSS/BLUE SHIELD

## 2015-02-02 ENCOUNTER — Other Ambulatory Visit (INDEPENDENT_AMBULATORY_CARE_PROVIDER_SITE_OTHER): Payer: BLUE CROSS/BLUE SHIELD

## 2015-02-02 DIAGNOSIS — R002 Palpitations: Secondary | ICD-10-CM | POA: Diagnosis not present

## 2015-02-02 DIAGNOSIS — E291 Testicular hypofunction: Secondary | ICD-10-CM

## 2015-02-02 LAB — CBC WITH DIFFERENTIAL/PLATELET
Basophils Absolute: 0 10*3/uL (ref 0.0–0.1)
Basophils Relative: 0.8 % (ref 0.0–3.0)
Eosinophils Absolute: 0.1 10*3/uL (ref 0.0–0.7)
Eosinophils Relative: 1.8 % (ref 0.0–5.0)
HCT: 45.2 % (ref 39.0–52.0)
HEMOGLOBIN: 15.4 g/dL (ref 13.0–17.0)
LYMPHS PCT: 39.3 % (ref 12.0–46.0)
Lymphs Abs: 1.4 10*3/uL (ref 0.7–4.0)
MCHC: 34.2 g/dL (ref 30.0–36.0)
MCV: 92.5 fl (ref 78.0–100.0)
MONOS PCT: 9.6 % (ref 3.0–12.0)
Monocytes Absolute: 0.3 10*3/uL (ref 0.1–1.0)
NEUTROS PCT: 48.5 % (ref 43.0–77.0)
Neutro Abs: 1.7 10*3/uL (ref 1.4–7.7)
Platelets: 201 10*3/uL (ref 150.0–400.0)
RBC: 4.88 Mil/uL (ref 4.22–5.81)
RDW: 12.9 % (ref 11.5–15.5)
WBC: 3.4 10*3/uL — ABNORMAL LOW (ref 4.0–10.5)

## 2015-02-02 LAB — TESTOSTERONE: Testosterone: 411.27 ng/dL (ref 300.00–890.00)

## 2015-02-02 NOTE — Progress Notes (Signed)
Patient ID: Casey Elliott, male   DOB: 03/18/1966, 49 y.o.   MRN: [id number] [employer] 48 hour holter monitor applied to patient.

## 2015-02-03 ENCOUNTER — Ambulatory Visit (HOSPITAL_BASED_OUTPATIENT_CLINIC_OR_DEPARTMENT_OTHER)
Admission: RE | Admit: 2015-02-03 | Discharge: 2015-02-03 | Disposition: A | Discharge status: Home / Self Care | Payer: BLUE CROSS/BLUE SHIELD | Source: Ambulatory Visit | Attending: Family Medicine | Admitting: Family Medicine

## 2015-02-03 DIAGNOSIS — R002 Palpitations: Secondary | ICD-10-CM | POA: Diagnosis not present

## 2015-02-03 DIAGNOSIS — R0600 Dyspnea, unspecified: Secondary | ICD-10-CM | POA: Insufficient documentation

## 2015-02-03 NOTE — Progress Notes (Signed)
  Echocardiogram 2D Echocardiogram has been performed.  Janalyn HarderWest, Shifra Swartzentruber R 02/03/2015, 3:44 PM

## 2015-02-09 ENCOUNTER — Encounter: Payer: Self-pay | Admitting: Family Medicine

## 2015-02-09 DIAGNOSIS — R002 Palpitations: Secondary | ICD-10-CM | POA: Insufficient documentation

## 2015-04-29 ENCOUNTER — Ambulatory Visit (INDEPENDENT_AMBULATORY_CARE_PROVIDER_SITE_OTHER): Payer: BLUE CROSS/BLUE SHIELD | Admitting: Family Medicine

## 2015-04-29 ENCOUNTER — Encounter: Payer: Self-pay | Admitting: Family Medicine

## 2015-04-29 VITALS — BP 120/72 | HR 77 | Ht 72.0 in | Wt 220.0 lb

## 2015-04-29 DIAGNOSIS — R059 Cough, unspecified: Secondary | ICD-10-CM

## 2015-04-29 DIAGNOSIS — R05 Cough: Secondary | ICD-10-CM | POA: Diagnosis not present

## 2015-04-29 DIAGNOSIS — L708 Other acne: Secondary | ICD-10-CM

## 2015-04-29 MED ORDER — PREDNISONE 20 MG PO TABS: ORAL_TABLET | ORAL | Status: AC

## 2015-04-29 NOTE — Progress Notes (Signed)
CC: Casey Elliott is a 49 y.o. male is here for Cough   Subjective: HPI:  Complains of nonproductive cough that has been present for 4 weeks now. Initially it was brought on with a sense of facial pressure in the sinuses, nasal congestion and postnasal drip. Now only the cough and mild postnasal drip remains. It's worse at night and first in the morning. His present all hours of the day at least a mild degree. He was able to play hockey on Sunday without any shortness of breath, fatigue or cough, he notices that actually when he was out on the ice symptoms were improved. No benefit from Mucinex. No other interventions as of yet. He denies wheezing, shortness of breath or chest pain. He had a fever of 101.0 a week ago but only for 1 or 2 days. He denies conjunctivitis, myalgias or joint pain. He has had a rash but it's been present for matter of months. It's localized on the forearms and at the base of the back of the neck. He describes as small red bumps occasionally with a little bit of pus in the center. He's tried a couple of over-the-counter soaps but none seemed to work better than the others.   Review Of Systems Outlined In HPI  Past Medical History  Diagnosis Date  . Hypertriglyceridemia 08/04/2014  . Hypogonadism in male 08/04/2014  . Hypothyroidism 08/04/2014  . Chronic prostatitis 08/04/2014    Past Surgical History  Procedure Laterality Date  . Tendon repair    . Appendectomy    . Shoulder arthroscopy     Family History  Problem Relation Age of Onset  . Other Neg Hx     hypogonadism    Social History   Social History  . Marital Status: Married    Spouse Name: N/A  . Number of Children: N/A  . Years of Education: N/A   Occupational History  . Not on file.   Social History Main Topics  . Smoking status: Never Smoker   . Smokeless tobacco: Not on file  . Alcohol Use: Not on file  . Drug Use: Not on file  . Sexual Activity: Not on file   Other Topics Concern   . Not on file   Social History Narrative     Objective: BP 120/72 mmHg  Pulse 77  Ht 6' (1.829 m)  Wt 220 lb (99.791 kg)  BMI 29.83 kg/m2  General: Alert and Oriented, No Acute Distress HEENT: Pupils equal, round, reactive to light. Conjunctivae clear.  External ears unremarkable, canals clear with intact TMs with appropriate landmarks.  Middle ear appears open without effusion. Pink inferior turbinates.  Moist mucous membranes, pharynx without inflammation nor lesions.  Neck supple without palpable lymphadenopathy nor abnormal masses. Lungs: Clear to auscultation bilaterally, no wheezing/ronchi/rales.  Comfortable work of breathing. Good air movement. Cardiac: Regular rate and rhythm. Normal S1/S2.  No murmurs, rubs, nor gallops.   Extremities: No peripheral edema.  Strong peripheral pulses.  Mental Status: No depression, anxiety, nor agitation. Skin: Warm and dry. Mild folliculitis on the elbows and base of posterior neck Trouble  Assessment & Plan: Casey Elliott was seen today for cough.  Diagnoses and all orders for this visit:  Cough  Follicular acne  Other orders -     predniSONE (DELTASONE) 20 MG tablet; Three tabs at once daily for five days.   Cough: Listening to his cough today in the office it sounds almost like he has croup. Start prednisone burst. Consider  over-the-counter throat lozenges. Follicular acne: Start Neutrogena over-the-counter salicylic acid soap to wash affected areas daily.  Return if symptoms worsen or fail to improve.

## 2015-05-21 ENCOUNTER — Encounter: Payer: Self-pay | Admitting: Endocrinology

## 2015-05-21 ENCOUNTER — Other Ambulatory Visit: Payer: Self-pay

## 2015-05-21 ENCOUNTER — Ambulatory Visit (INDEPENDENT_AMBULATORY_CARE_PROVIDER_SITE_OTHER): Payer: BLUE CROSS/BLUE SHIELD | Admitting: Endocrinology

## 2015-05-21 VITALS — BP 138/87 | HR 93 | Temp 98.1°F | Ht 72.0 in | Wt 217.0 lb

## 2015-05-21 DIAGNOSIS — E291 Testicular hypofunction: Secondary | ICD-10-CM | POA: Diagnosis not present

## 2015-05-21 LAB — CBC WITH DIFFERENTIAL/PLATELET
BASOS ABS: 0 10*3/uL (ref 0.0–0.1)
Basophils Relative: 0.3 % (ref 0.0–3.0)
EOS PCT: 1.1 % (ref 0.0–5.0)
Eosinophils Absolute: 0 10*3/uL (ref 0.0–0.7)
HCT: 45.3 % (ref 39.0–52.0)
HEMOGLOBIN: 15.5 g/dL (ref 13.0–17.0)
LYMPHS ABS: 1.2 10*3/uL (ref 0.7–4.0)
LYMPHS PCT: 31.7 % (ref 12.0–46.0)
MCHC: 34.3 g/dL (ref 30.0–36.0)
MCV: 93.6 fl (ref 78.0–100.0)
MONOS PCT: 8.9 % (ref 3.0–12.0)
Monocytes Absolute: 0.3 10*3/uL (ref 0.1–1.0)
NEUTROS PCT: 58 % (ref 43.0–77.0)
Neutro Abs: 2.2 10*3/uL (ref 1.4–7.7)
Platelets: 204 10*3/uL (ref 150.0–400.0)
RBC: 4.84 Mil/uL (ref 4.22–5.81)
RDW: 12.6 % (ref 11.5–15.5)
WBC: 3.7 10*3/uL — AB (ref 4.0–10.5)

## 2015-05-21 LAB — TSH: TSH: 1.46 u[IU]/mL (ref 0.35–4.50)

## 2015-05-21 LAB — TESTOSTERONE: Testosterone: 306.76 ng/dL (ref 300.00–890.00)

## 2015-05-21 MED ORDER — CLOMIPHENE CITRATE 50 MG PO TABS: ORAL_TABLET | ORAL | Status: DC

## 2015-05-21 NOTE — Patient Instructions (Addendum)
blood tests are being requested for you today.  We'll let you know about the results.   Please return in 1 year.   normalization of testosterone is not known to harm you.  however, there are "theoretical" risks, including increased fertility, hair loss, prostate cancer, benign prostate enlargement, blood clots, liver problems, lower hdl ("good cholesterol"), polycythemia (opposite of anemia), sleep apnea, and behavior changes.  Weight loss also helps increase the testosterone, so please continue this good trend.

## 2015-05-21 NOTE — Progress Notes (Signed)
Subjective:    Patient ID: Casey Elliott, male    DOB: 1966/04/03, 49 y.o.   MRN: [id number]  HPI Pt returns for f/u of idiopathic central hypogonadism: (dx'ed 2016; he has 3 biological children.  He says he has never taken illicit androgens; he took arimidex after a short time after dx). Since on the clomid, pt states he feels well in general, except for slight heat intolerance Past Medical History  Diagnosis Date  . Hypertriglyceridemia 08/04/2014  . Hypogonadism in male 08/04/2014  . Hypothyroidism 08/04/2014  . Chronic prostatitis 08/04/2014    Past Surgical History  Procedure Laterality Date  . Tendon repair    . Appendectomy    . Shoulder arthroscopy      Social History   Social History  . Marital Status: Married    Spouse Name: N/A  . Number of Children: N/A  . Years of Education: N/A   Occupational History  . Not on file.   Social History Main Topics  . Smoking status: Never Smoker   . Smokeless tobacco: Not on file  . Alcohol Use: Not on file  . Drug Use: Not on file  . Sexual Activity: Not on file   Other Topics Concern  . Not on file   Social History Narrative    Current Outpatient Prescriptions on File Prior to Visit  Medication Sig Dispense Refill  . ALPRAZolam (XANAX) 0.5 MG tablet Take 1 tablet (0.5 mg total) by mouth at bedtime as needed for anxiety. 30 tablet 0  . anastrozole (ARIMIDEX) 1 MG tablet Take 1 tablet (1 mg total) by mouth daily. 30 tablet 2  . Icosapent Ethyl (VASCEPA) 1 G CAPS Take 2 capsules by mouth 2 (two) times daily with a meal. 120 capsule 11  . mupirocin ointment (BACTROBAN) 2 % One application to folliculitis twice a day 22 g 0  . triamcinolone (NASACORT AQ) 55 MCG/ACT AERO nasal inhaler Place 2 sprays into the nose daily. 1 Inhaler 12  . zolpidem (AMBIEN) 5 MG tablet Take 1 tablet (5 mg total) by mouth at bedtime as needed for sleep. (Patient not taking: Reported on 05/21/2015) 7 tablet 0   No current  facility-administered medications on file prior to visit.    Allergies  Allergen Reactions  . Augmentin [Amoxicillin-Pot Clavulanate]     rash  . Ciprofloxacin Other (See Comments)    Body aches  . Levofloxacin     Tendon Rupture  . Meperidine Hcl     Family History  Problem Relation Age of Onset  . Other Neg Hx     hypogonadism    BP 138/87 mmHg  Pulse 93  Temp(Src) 98.1 F (36.7 C) (Oral)  Ht 6' (1.829 m)  Wt 217 lb (98.431 kg)  BMI 29.42 kg/m2  SpO2 97%   Review of Systems Breast swelling is resolved. Denies edema.     Objective:   Physical Exam VITAL SIGNS:  See vs page GENERAL: no distress Ext: no edema   Lab Results  Component Value Date   TESTOSTERONE 385 05/21/2015   TESTOSTERONE 306.76 05/21/2015      Assessment & Plan:  Hypogonadism: well-controlled  Patient is advised the following: Patient Instructions  blood tests are being requested for you today.  We'll let you know about the results.   Please return in 1 year.   normalization of testosterone is not known to harm you.  however, there are "theoretical" risks, including increased fertility, hair loss, prostate cancer, benign prostate enlargement,  blood clots, liver problems, lower hdl ("good cholesterol"), polycythemia (opposite of anemia), sleep apnea, and behavior changes.  Weight loss also helps increase the testosterone, so please continue this good trend.

## 2015-05-22 LAB — TESTOSTERONE,FREE AND TOTAL
TESTOSTERONE FREE: 12.3 pg/mL (ref 6.8–21.5)
Testosterone: 385 ng/dL (ref 348–1197)

## 2015-05-27 ENCOUNTER — Emergency Department (INDEPENDENT_AMBULATORY_CARE_PROVIDER_SITE_OTHER): Payer: BLUE CROSS/BLUE SHIELD

## 2015-05-27 ENCOUNTER — Emergency Department
Admission: EM | Admit: 2015-05-27 | Discharge: 2015-05-27 | Disposition: A | Discharge status: Home / Self Care | Payer: BLUE CROSS/BLUE SHIELD | Source: Home / Self Care | Attending: Family Medicine | Admitting: Family Medicine

## 2015-05-27 ENCOUNTER — Encounter: Payer: Self-pay | Admitting: *Deleted

## 2015-05-27 DIAGNOSIS — J0100 Acute maxillary sinusitis, unspecified: Secondary | ICD-10-CM | POA: Diagnosis not present

## 2015-05-27 DIAGNOSIS — R0981 Nasal congestion: Secondary | ICD-10-CM | POA: Diagnosis not present

## 2015-05-27 MED ORDER — FLUTICASONE PROPIONATE 50 MCG/ACT NA SUSP: NASAL | Status: DC

## 2015-05-27 MED ORDER — AZITHROMYCIN 250 MG PO TABS: ORAL_TABLET | ORAL | Status: DC

## 2015-05-27 MED ORDER — PREDNISONE 20 MG PO TABS: ORAL_TABLET | ORAL | Status: DC

## 2015-05-27 NOTE — Discharge Instructions (Signed)
Take plain guaifenesin (  extended release tabs such as Mucinex) twice daily, with plenty of water, for cough and congestion.  May add Pseudoephedrine ( , one or two every 4 to 6 hours) for sinus congestion.  Get adequate rest.   May use Afrin nasal spray (or generic oxymetazoline) twice daily for about 5 days and then discontinue.  Also recommend using saline nasal spray several times daily and saline nasal irrigation (AYR is a common brand).  Use Flonase nasal spray each morning after using Afrin nasal spray and saline nasal irrigation. Stop all antihistamines for now, and other non-prescription cough/cold preparations. May take Delsym Cough Suppressant at bedtime for nighttime cough.   Follow-up with family doctor if not improving about10 days.

## 2015-05-27 NOTE — ED Provider Notes (Signed)
CSN: [id number]     Arrival date & time 05/27/15  1726 History   First MD Initiated Contact with Patient 05/27/15 1732     Chief Complaint  Patient presents with  . Sinus Problem  . Cough      HPI Comments: While on a cruise 1.5 months ago patient developed a typical viral URI.  He improved except for persistent nasal congestion, post-nasal drainage, and a non-productive cough.  He visited his PCP one month ago who prescribed a prednisone burst for five days.  His sinus congestion improved but did not resolve.  During the past week he has had increased right facial and right frontal pain.  His right ear feels somewhat full but not painful.  He denies fevers, chills, and sweats.  He denies wheezing, pleuritic pain, and shortness of breath. He has seasonal rhinitis (spring and fall), and his symptoms have not improved with Allegra D.   The history is provided by the patient.    Past Medical History  Diagnosis Date  . Hypertriglyceridemia 08/04/2014  . Hypogonadism in male 08/04/2014  . Hypothyroidism 08/04/2014  . Chronic prostatitis 08/04/2014   Past Surgical History  Procedure Laterality Date  . Tendon repair    . Appendectomy    . Shoulder arthroscopy     Family History  Problem Relation Age of Onset  . Other Neg Hx     hypogonadism   Social History  Substance Use Topics  . Smoking status: Never Smoker   . Smokeless tobacco: None  . Alcohol Use: None    Review of Systems No sore throat + cough No pleuritic pain No wheezing + nasal congestion + post-nasal drainage + sinus pain/pressure No itchy/red eyes ? Right earache No hemoptysis No SOB No fever/chills No nausea No vomiting No abdominal pain No diarrhea No urinary symptoms No skin rash No fatigue No myalgias + headache    Allergies  Augmentin; Ciprofloxacin; Levofloxacin; and Meperidine hcl  Home Medications   Prior to Admission medications   Medication Sig Start Date End Date Taking? Authorizing  Provider  ALPRAZolam Prudy Feeler) 0.5 MG tablet Take 1 tablet (0.5 mg total) by mouth at bedtime as needed for anxiety. 11/17/14   Sean Hommel, DO  anastrozole (ARIMIDEX) 1 MG tablet Take 1 tablet (1 mg total) by mouth daily. 09/23/14   Sean Hommel, DO  azithromycin (ZITHROMAX Z-PAK) 250 MG tablet Take 2 tabs today; then begin one tab once daily for 4 more days. 05/27/15   Lattie Haw, MD  clomiPHENE (CLOMID) 50 MG tablet 1/4 tab daily 05/21/15   Romero Belling, MD  fluticasone [facility]) 50 MCG/ACT nasal spray Place two sprays in each nostril once daily 05/27/15   Lattie Haw, MD  Icosapent Ethyl (VASCEPA) 1 G CAPS Take 2 capsules by mouth 2 (two) times daily with a meal. 08/21/14   Sean Hommel, DO  predniSONE (DELTASONE) 20 MG tablet Take one tab twice daily for four days, then one daily for 3 days. Take with food. 05/27/15   Lattie Haw, MD  triamcinolone (NASACORT AQ) 55 MCG/ACT AERO nasal inhaler Place 2 sprays into the nose daily. 08/04/14   Sean Hommel, DO  zolpidem (AMBIEN) 5 MG tablet Take 1 tablet (5 mg total) by mouth at bedtime as needed for sleep. Patient not taking: Reported on 05/21/2015 01/25/15   Laren Boom, DO   Meds Ordered and Administered this Visit  Medications - No data to display  BP 150/96 mmHg  Pulse 95  Temp(Src) 99 F (37.2 C) (Oral)  Resp 16  Ht 6' (1.829 m)  Wt 219 lb (99.338 kg)  BMI 29.70 kg/m2  SpO2 97% No data found.   Physical Exam Nursing notes and Vital Signs reviewed. Appearance:  Patient appears stated age, and in no acute distress Eyes:  Pupils are equal, round, and reactive to light and accomodation.  Extraocular movement is intact.  Conjunctivae are not inflamed  Ears:  Canals normal.  Tympanic membranes normal.  Nose:  Mildly congested turbinates bilaterally.   Right maxillary and right frontal sinus tenderness is present.  Pharynx:  Normal Neck:  Supple.  No adenopathy Lungs:  Clear to auscultation.  Breath sounds are equal.  Moving air well. Heart:   Regular rate and rhythm without murmurs, rubs, or gallops.  Abdomen:  Nontender   Extremities:  No edema.   .   ED Course  Procedures  None   Imaging Review Dg Sinuses Complete  05/27/2015   CLINICAL DATA:  One month history of right-sided paranasal sinus pain and congestion  EXAM: PARANASAL SINUSES - COMPLETE 3 + VIEW  COMPARISON:  None.  FINDINGS: Elijah Birk, water's, and lateral views obtained. Paranasal sinuses are clear. Note that frontal sinuses are somewhat hypoplastic bilaterally. There is no air-fluid level. No bony destruction or expansion. Mastoids bilaterally appear clear.  IMPRESSION: No abnormality noted.   Electronically Signed   By: Bretta Bang III M.D.   On: 05/27/2015 18:57      MDM   1. Acute maxillary sinusitis, recurrence not specified    Begin prednisone taper.  Rx for Z-pak for atypical coverage of persistent cough.  Rx for Flonase.  Take plain guaifenesin (1200mg  extended release tabs such as Mucinex) twice daily, with plenty of water, for cough and congestion.  May add Pseudoephedrine (30mg , one or two every 4 to 6 hours) for sinus congestion.  Get adequate rest.   May use Afrin nasal spray (or generic oxymetazoline) twice daily for about 5 days and then discontinue.  Also recommend using saline nasal spray several times daily and saline nasal irrigation (AYR is a common brand).  Use Flonase nasal spray each morning after using Afrin nasal spray and saline nasal irrigation. Stop all antihistamines for now, and other non-prescription cough/cold preparations. May take Delsym Cough Suppressant at bedtime for nighttime cough.   Follow-up with family doctor if not improving about10 days.     Lattie Haw, MD 05/27/15 [phone]

## 2015-05-27 NOTE — ED Notes (Signed)
Pt c/o 1 1/2 -2 months of cough and sinus pain congestion and drainage. Saw Dr. Ivan Anchors who prescribed steroid with some relief. Afebrile

## 2015-05-30 ENCOUNTER — Telehealth: Payer: Self-pay

## 2015-09-10 ENCOUNTER — Other Ambulatory Visit: Payer: Self-pay | Admitting: Family Medicine

## 2015-11-27 ENCOUNTER — Other Ambulatory Visit: Payer: Self-pay | Admitting: Family Medicine

## 2016-05-23 ENCOUNTER — Other Ambulatory Visit: Payer: Self-pay

## 2016-05-23 ENCOUNTER — Ambulatory Visit (INDEPENDENT_AMBULATORY_CARE_PROVIDER_SITE_OTHER): Payer: BLUE CROSS/BLUE SHIELD | Admitting: Endocrinology

## 2016-05-23 ENCOUNTER — Other Ambulatory Visit (INDEPENDENT_AMBULATORY_CARE_PROVIDER_SITE_OTHER): Payer: BLUE CROSS/BLUE SHIELD

## 2016-05-23 VITALS — BP 142/94 | HR 93 | Ht 72.0 in | Wt 228.0 lb

## 2016-05-23 DIAGNOSIS — E291 Testicular hypofunction: Secondary | ICD-10-CM

## 2016-05-23 LAB — CBC WITH DIFFERENTIAL/PLATELET
BASOS PCT: 0.3 % (ref 0.0–3.0)
Basophils Absolute: 0 10*3/uL (ref 0.0–0.1)
EOS PCT: 1.7 % (ref 0.0–5.0)
Eosinophils Absolute: 0.1 10*3/uL (ref 0.0–0.7)
HCT: 46 % (ref 39.0–52.0)
Hemoglobin: 16.1 g/dL (ref 13.0–17.0)
LYMPHS ABS: 1.5 10*3/uL (ref 0.7–4.0)
Lymphocytes Relative: 27.7 % (ref 12.0–46.0)
MCHC: 35 g/dL (ref 30.0–36.0)
MCV: 92.2 fl (ref 78.0–100.0)
MONO ABS: 0.4 10*3/uL (ref 0.1–1.0)
MONOS PCT: 7.3 % (ref 3.0–12.0)
NEUTROS ABS: 3.3 10*3/uL (ref 1.4–7.7)
NEUTROS PCT: 63 % (ref 43.0–77.0)
Platelets: 215 10*3/uL (ref 150.0–400.0)
RBC: 4.99 Mil/uL (ref 4.22–5.81)
RDW: 12.3 % (ref 11.5–15.5)
WBC: 5.3 10*3/uL (ref 4.0–10.5)

## 2016-05-23 LAB — TSH: TSH: 4.54 u[IU]/mL — AB (ref 0.35–4.50)

## 2016-05-23 NOTE — Patient Instructions (Addendum)
blood tests are being requested for you today.  We'll let you know about the results.   Please return in 1 year.   Testosterone treatment has risks, including increased or decreased fertility (depending on the type of treatment), hair loss, prostate cancer, benign prostate enlargement, blood clots, liver problems, lower hdl ("good cholesterol"), polycythemia (opposite of anemia), sleep apnea, and behavior changes.  Weight loss also helps increase the testosterone.  Please have your blood pressure rechecked with Dr Ivan AnchorsHommel soon.

## 2016-05-23 NOTE — Progress Notes (Signed)
   Subjective:    Patient ID: Casey Elliott, male    DOB: 04/03/1966, 50 y.o.   MRN: [id number]  HPI Pt returns for f/u of idiopathic central hypogonadism: (dx'ed 2016; he has 3 biological children; he says he has never taken illicit androgens; he took arimidex after a short time after dx). Since on the clomid, pt states he feels well in general, except for intermittent ED symptoms.   Past Medical History:  Diagnosis Date  . Chronic prostatitis 08/04/2014  . Hypertriglyceridemia 08/04/2014  . Hypogonadism in male 08/04/2014  . Hypothyroidism 08/04/2014    Past Surgical History:  Procedure Laterality Date  . APPENDECTOMY    . SHOULDER ARTHROSCOPY    . TENDON REPAIR      Social History   Social History  . Marital status: Married    Spouse name: N/A  . Number of children: N/A  . Years of education: N/A   Occupational History  . Not on file.   Social History Main Topics  . Smoking status: Never Smoker  . Smokeless tobacco: Not on file  . Alcohol use Not on file  . Drug use: Unknown  . Sexual activity: Not on file   Other Topics Concern  . Not on file   Social History Narrative  . No narrative on file    Current Outpatient Prescriptions on File Prior to Visit  Medication Sig Dispense Refill  . ALPRAZolam (XANAX) 0.5 MG tablet Take 1 tablet (0.5 mg total) by mouth at bedtime as needed for anxiety. 30 tablet 0  . clomiPHENE (CLOMID) 50 MG tablet 1/4 tab daily 15 tablet 3  . fluticasone (FLONASE) 50 MCG/ACT nasal spray Place two sprays in each nostril once daily 16 g 1  . Icosapent Ethyl (VASCEPA) 1 g CAPS Take 2 capsules by mouth twice daily with meal.  NEED FOLLOW UP APPOINTMENT FOR MORE REFILLS. 120 capsule 0  . triamcinolone (NASACORT AQ) 55 MCG/ACT AERO nasal inhaler Place 2 sprays into the nose daily. 1 Inhaler 12   No current facility-administered medications on file prior to visit.     Allergies  Allergen Reactions  . Augmentin [Amoxicillin-Pot  Clavulanate]     rash  . Ciprofloxacin Other (See Comments)    Body aches  . Levofloxacin     Tendon Rupture  . Meperidine Hcl     Family History  Problem Relation Age of Onset  . Other Neg Hx     hypogonadism    BP (!) 142/94   Pulse 93   Ht 6' (1.829 m)   Wt 228 lb (103.4 kg)   SpO2 97%   BMI 30.92 kg/m   Review of Systems He has gained weight.      Objective:   Physical Exam VITAL SIGNS:  See vs page.  GENERAL: no distress Chest wall: mild bilat pseudogynecomastia.      Assessment & Plan:  HTN: possibly situational Hypogonadism: due for recheck.

## 2016-05-24 MED ORDER — CLOMIPHENE CITRATE 50 MG PO TABS: ORAL_TABLET | ORAL | 3 refills | Status: DC

## 2016-05-29 LAB — ESTRADIOL, FREE
Estradiol, Free: 1.09 pg/mL — ABNORMAL HIGH (ref <=0.45)
Estradiol: 49 pg/mL — ABNORMAL HIGH (ref <=29)

## 2016-06-07 ENCOUNTER — Other Ambulatory Visit: Payer: BLUE CROSS/BLUE SHIELD

## 2016-06-07 ENCOUNTER — Other Ambulatory Visit: Payer: Self-pay | Admitting: Endocrinology

## 2016-06-07 DIAGNOSIS — E291 Testicular hypofunction: Secondary | ICD-10-CM

## 2016-06-09 LAB — TESTOSTERONE, FREE, TOTAL, SHBG
SEX HORMONE BINDING: 24.2 nmol/L (ref 16.5–55.9)
Testosterone, Free: 9.9 pg/mL (ref 6.8–21.5)
Testosterone: 358 ng/dL (ref 264–916)

## 2016-11-17 ENCOUNTER — Encounter: Payer: Self-pay | Admitting: Sports Medicine

## 2016-11-17 ENCOUNTER — Ambulatory Visit (INDEPENDENT_AMBULATORY_CARE_PROVIDER_SITE_OTHER): Payer: BLUE CROSS/BLUE SHIELD | Admitting: Sports Medicine

## 2016-11-17 DIAGNOSIS — R7989 Other specified abnormal findings of blood chemistry: Secondary | ICD-10-CM

## 2016-11-17 DIAGNOSIS — G4700 Insomnia, unspecified: Secondary | ICD-10-CM | POA: Diagnosis not present

## 2016-11-17 DIAGNOSIS — E291 Testicular hypofunction: Secondary | ICD-10-CM | POA: Diagnosis not present

## 2016-11-17 DIAGNOSIS — J0100 Acute maxillary sinusitis, unspecified: Secondary | ICD-10-CM

## 2016-11-17 DIAGNOSIS — R946 Abnormal results of thyroid function studies: Secondary | ICD-10-CM

## 2016-11-17 MED ORDER — CLOMIPHENE CITRATE 50 MG PO TABS: 25.0000 mg | ORAL_TABLET | Freq: Every day | ORAL | 3 refills | Status: DC

## 2016-11-17 MED ORDER — ALPRAZOLAM 0.5 MG PO TABS: 0.5000 mg | ORAL_TABLET | Freq: Every evening | ORAL | 0 refills | Status: DC | PRN

## 2016-11-17 MED ORDER — AZITHROMYCIN 250 MG PO TABS: ORAL_TABLET | ORAL | 0 refills | Status: DC

## 2016-11-17 NOTE — Assessment & Plan Note (Signed)
Only has occasional episodes of panic in the middle of the night, takes alprazolam, the most recent prescription lasted him one year, I'm okay with refilling this for now.

## 2016-11-17 NOTE — Progress Notes (Signed)
  Subjective:    CC: Establish care.   HPI:  This is a pleasant 51 year old male here to establish care.  Secondary hypogonadism: On one quarter of a Clomid tab daily, testosterone levels have been around 300.  Hypertriglyceridemia: Has stopped his fish oil.  Anxiety: Occurs often the middle of the night, his initial prescription lasted him one year, agrees to make his current prescription last an entire year.  Elevated TSH: No T3 or T4 levels were done, does have some degree of fatigue, agrees to follow this up at his next lab test in a month.  Sinus infection: Present for over a week, pain over the maxillary sinuses, nasal discharge, mild cough, symptoms are moderate, persistent.  Past medical history:  Negative.  See flowsheet/record as well for more information.  Surgical history: Negative.  See flowsheet/record as well for more information.  Family history: Negative.  See flowsheet/record as well for more information.  Social history: Negative.  See flowsheet/record as well for more information.  Allergies, and medications have been entered into the medical record, reviewed, and no changes needed.    Review of Systems: No headache, visual changes, nausea, vomiting, diarrhea, constipation, dizziness, abdominal pain, skin rash, fevers, chills, night sweats, swollen lymph nodes, weight loss, chest pain, body aches, joint swelling, muscle aches, shortness of breath, mood changes, visual or auditory hallucinations.  Objective:    General: Well Developed, well nourished, and in no acute distress.  Neuro: Alert and oriented x3, extra-ocular muscles intact, sensation grossly intact.  HEENT: Normocephalic, atraumatic, pupils equal round reactive to light, neck supple, no masses, no lymphadenopathy, thyroid nonpalpable. Oropharynx, nasopharynx, ear canals unremarkable with the exception of erythematous turbinates and tenderness to the maxillary sinuses. Skin: Warm and dry, no rashes noted.    Cardiac: Regular rate and rhythm, no murmurs rubs or gallops.  Respiratory: Clear to auscultation bilaterally. Not using accessory muscles, speaking in full sentences.  Abdominal: Soft, nontender, nondistended, positive bowel sounds, no masses, no organomegaly.  Musculoskeletal: Shoulder, elbow, wrist, hip, knee, ankle stable, and with full range of motion.  Impression and Recommendations:    The patient was counselled, risk factors were discussed, anticipatory guidance given.  Secondary male hypogonadism Increasing Clomid to one half tab daily. Rechecking testosterone in one month  Insomnia Only has occasional episodes of panic in the middle of the night, takes alprazolam, the most recent prescription lasted him one year, I'm okay with refilling this for now.  Elevated TSH Suspect some clinical hypothyroidism, we can recheck this at his 1 month follow-up for testosterone along with a T3 and T4.  Acute maxillary sinusitis Azithromycin, he will continue the Flonase that he already has.

## 2016-11-17 NOTE — Assessment & Plan Note (Signed)
Azithromycin, he will continue the Flonase that he already has.

## 2016-11-17 NOTE — Assessment & Plan Note (Signed)
Suspect some clinical hypothyroidism, we can recheck this at his 1 month follow-up for testosterone along with a T3 and T4.

## 2016-11-17 NOTE — Assessment & Plan Note (Signed)
Increasing Clomid to one half tab daily. Rechecking testosterone in one month

## 2016-12-25 ENCOUNTER — Ambulatory Visit (INDEPENDENT_AMBULATORY_CARE_PROVIDER_SITE_OTHER): Payer: BLUE CROSS/BLUE SHIELD | Admitting: Sports Medicine

## 2016-12-25 ENCOUNTER — Encounter: Payer: Self-pay | Admitting: Sports Medicine

## 2016-12-25 DIAGNOSIS — R946 Abnormal results of thyroid function studies: Secondary | ICD-10-CM

## 2016-12-25 DIAGNOSIS — F5101 Primary insomnia: Secondary | ICD-10-CM | POA: Diagnosis not present

## 2016-12-25 DIAGNOSIS — E291 Testicular hypofunction: Secondary | ICD-10-CM | POA: Diagnosis not present

## 2016-12-25 DIAGNOSIS — R7989 Other specified abnormal findings of blood chemistry: Secondary | ICD-10-CM

## 2016-12-25 LAB — TSH: TSH: 4.46 m[IU]/L (ref 0.40–4.50)

## 2016-12-25 LAB — T3, FREE: T3, Free: 3.2 pg/mL (ref 2.3–4.2)

## 2016-12-25 LAB — T4, FREE: Free T4: 1 ng/dL (ref 0.8–1.8)

## 2016-12-25 NOTE — Progress Notes (Signed)
  Subjective:    CC: Follow-up  HPI: Secondary hypergonadism: Doing well with the increase to one half tab of Clomid. Due for a recheck and labs. Did have some initial headaches, no irritability.  He did, he is having some vision changes, combined with the thyroid abnormalities he was asking if he needs a brain MRI.  Subclinical hypothyroidism: Due to recheck TSH and T3, T4.  Past medical history:  Negative.  See flowsheet/record as well for more information.  Surgical history: Negative.  See flowsheet/record as well for more information.  Family history: Negative.  See flowsheet/record as well for more information.  Social history: Negative.  See flowsheet/record as well for more information.  Allergies, and medications have been entered into the medical record, reviewed, and no changes needed.   Review of Systems: No fevers, chills, night sweats, weight loss, chest pain, or shortness of breath.   Objective:    General: Well Developed, well nourished, and in no acute distress.  Neuro: Alert and oriented x3, extra-ocular muscles intact, sensation grossly intact.  HEENT: Normocephalic, atraumatic, pupils equal round reactive to light, neck supple, no masses, no lymphadenopathy, thyroid nonpalpable.  Skin: Warm and dry, no rashes. Cardiac: Regular rate and rhythm, no murmurs rubs or gallops, no lower extremity edema.  Respiratory: Clear to auscultation bilaterally. Not using accessory muscles, speaking in full sentences.  Impression and Recommendations:    Elevated TSH Rechecking TSH, T3, T4  Insomnia He does take alprazolam at night, he understands that he should minimize use of this. Does snore significantly, he is not yet ready to proceed with a sleep study. Would like to lose significant weight first.  Secondary male hypogonadism Doing well with the increase to 25 mg of Clomid, rechecking testosterone levels. Considering persistent headaches, vision loss I am going to obtain a  brain MRI with and without contrast.  I spent 25 minutes with this patient, greater than 50% was face-to-face time counseling regarding the above diagnoses

## 2016-12-25 NOTE — Assessment & Plan Note (Signed)
He does take alprazolam at night, he understands that he should minimize use of this. Does snore significantly, he is not yet ready to proceed with a sleep study. Would like to lose significant weight first.

## 2016-12-25 NOTE — Assessment & Plan Note (Signed)
Doing well with the increase to 25 mg of Clomid, rechecking testosterone levels. Considering persistent headaches, vision loss I am going to obtain a brain MRI with and without contrast.

## 2016-12-25 NOTE — Assessment & Plan Note (Signed)
Rechecking TSH, T3, T4 

## 2016-12-26 LAB — TESTOSTERONE TOTAL,FREE,BIO, MALES
Albumin: 4.3 g/dL (ref 3.6–5.1)
Sex Hormone Binding: 27 nmol/L (ref 10–50)
Testosterone, Bioavailable: 161 ng/dL (ref 110.0–575.0)
Testosterone, Free: 81.8 pg/mL (ref 46.0–224.0)
Testosterone: 505 ng/dL (ref 250–827)

## 2016-12-27 ENCOUNTER — Encounter: Payer: Self-pay | Admitting: Sports Medicine

## 2016-12-28 MED ORDER — SILDENAFIL CITRATE 100 MG PO TABS: ORAL_TABLET | ORAL | 11 refills | Status: DC

## 2017-01-01 ENCOUNTER — Ambulatory Visit (INDEPENDENT_AMBULATORY_CARE_PROVIDER_SITE_OTHER): Payer: BLUE CROSS/BLUE SHIELD

## 2017-01-01 DIAGNOSIS — H538 Other visual disturbances: Secondary | ICD-10-CM | POA: Diagnosis not present

## 2017-01-01 DIAGNOSIS — R51 Headache: Secondary | ICD-10-CM

## 2017-01-01 DIAGNOSIS — E291 Testicular hypofunction: Secondary | ICD-10-CM

## 2017-01-01 MED ORDER — GADOBENATE DIMEGLUMINE 529 MG/ML IV SOLN
10.0000 mL | Freq: Once | INTRAVENOUS | Status: AC | PRN
  Administered 2017-01-01: 10 mL via INTRAVENOUS

## 2017-01-11 ENCOUNTER — Telehealth: Payer: Self-pay

## 2017-01-11 NOTE — Telephone Encounter (Signed)
Pharmacy called asking if the sildenafil 100 mg can be switched to 20 mg so that they can use discount card. Please advise.

## 2017-01-13 MED ORDER — SILDENAFIL CITRATE 20 MG PO TABS: 20.0000 mg | ORAL_TABLET | ORAL | 11 refills | Status: DC | PRN

## 2017-01-13 NOTE — Telephone Encounter (Signed)
Done

## 2017-01-16 ENCOUNTER — Other Ambulatory Visit: Payer: Self-pay | Admitting: Sports Medicine

## 2017-05-23 ENCOUNTER — Ambulatory Visit: Payer: BLUE CROSS/BLUE SHIELD | Admitting: Endocrinology

## 2017-09-24 ENCOUNTER — Ambulatory Visit: Payer: BLUE CROSS/BLUE SHIELD | Admitting: Osteopathic Medicine

## 2017-09-24 ENCOUNTER — Ambulatory Visit: Payer: BLUE CROSS/BLUE SHIELD | Admitting: Sports Medicine

## 2017-09-24 DIAGNOSIS — J0100 Acute maxillary sinusitis, unspecified: Secondary | ICD-10-CM | POA: Diagnosis not present

## 2017-09-24 DIAGNOSIS — E291 Testicular hypofunction: Secondary | ICD-10-CM | POA: Diagnosis not present

## 2017-09-24 DIAGNOSIS — I10 Essential (primary) hypertension: Secondary | ICD-10-CM

## 2017-09-24 DIAGNOSIS — Z0000 Encounter for general adult medical examination without abnormal findings: Secondary | ICD-10-CM | POA: Insufficient documentation

## 2017-09-24 DIAGNOSIS — R0683 Snoring: Secondary | ICD-10-CM | POA: Diagnosis not present

## 2017-09-24 MED ORDER — AZITHROMYCIN 250 MG PO TABS: ORAL_TABLET | ORAL | 0 refills | Status: DC

## 2017-09-24 NOTE — Assessment & Plan Note (Signed)
The patient will return for an annual physical, checking routine blood work.

## 2017-09-24 NOTE — Progress Notes (Signed)
Subjective:    CC: Sinus infection  HPI: This is a very pleasant 52 year old male, for 3 weeks now he has had frontal and slight maxillary sinus pain and pressure, nasal discharge, he did get better and then got worse again, minimal cough, no constitutional symptoms, no GI symptoms, no shortness of breath.  Symptoms are moderate, persistent.  Hypogonadism: Secondary, was on Clomid, felt well, self discontinued and felt about the same.  I reviewed the past medical history, family history, social history, surgical history, and allergies today and no changes were needed.  Please see the problem list section below in epic for further details.  Past Medical History: Past Medical History:  Diagnosis Date  . Chronic prostatitis 08/04/2014  . Hypertriglyceridemia 08/04/2014  . Hypogonadism in male 08/04/2014  . Hypothyroidism 08/04/2014   Past Surgical History: Past Surgical History:  Procedure Laterality Date  . APPENDECTOMY    . SHOULDER ARTHROSCOPY    . TENDON REPAIR     Social History: Social History   Socioeconomic History  . Marital status: Married    Spouse name: Not on file  . Number of children: Not on file  . Years of education: Not on file  . Highest education level: Not on file  Social Needs  . Financial resource strain: Not on file  . Food insecurity - worry: Not on file  . Food insecurity - inability: Not on file  . Transportation needs - medical: Not on file  . Transportation needs - non-medical: Not on file  Occupational History  . Not on file  Tobacco Use  . Smoking status: Never Smoker  . Smokeless tobacco: Never Used  Substance and Sexual Activity  . Alcohol use: Not on file  . Drug use: Not on file  . Sexual activity: Not on file  Other Topics Concern  . Not on file  Social History Narrative  . Not on file   Family History: Family History  Problem Relation Age of Onset  . Other Neg Hx        hypogonadism   Allergies: Allergies  Allergen  Reactions  . Augmentin [Amoxicillin-Pot Clavulanate]     rash  . Ciprofloxacin Other (See Comments)    Body aches  . Levofloxacin     Tendon Rupture  . Meperidine     Blood pressure drop  . Meperidine Hcl    Medications: See med rec.  Review of Systems: No fevers, chills, night sweats, weight loss, chest pain, or shortness of breath.   Objective:    General: Well Developed, well nourished, and in no acute distress.  Neuro: Alert and oriented x3, extra-ocular muscles intact, sensation grossly intact.  HEENT: Normocephalic, atraumatic, pupils equal round reactive to light, neck supple, no masses, no lymphadenopathy, thyroid nonpalpable.  Oropharynx, nasopharynx, ear canals unremarkable with the exception of mild erythema likely from postnasal drip syndrome. Skin: Warm and dry, no rashes. Cardiac: Regular rate and rhythm, no murmurs rubs or gallops, no lower extremity edema.  Respiratory: Clear to auscultation bilaterally. Not using accessory muscles, speaking in full sentences.  Impression and Recommendations:    Essential hypertension We will keep an eye on his blood pressures. Checking routine blood work.  Annual physical exam The patient will return for an annual physical, checking routine blood work.   Secondary male hypogonadism Patient self discontinued his Clomid, he actually felt normal off of Clomid. Rechecking testosterone levels. I did advise him that we really do not need to check this.  Snoring With hypertension  and nonrestorative sleep. Adding a home sleep study  Acute non-recurrent maxillary sinusitis Present for 3 weeks, double sickening. Continue Flonase, adding azithromycin.  ___________________________________________ Ihor Austin. Benjamin Stain, M.D., ABFM., CAQSM. Primary Care and Sports Medicine [facility]  Adjunct Instructor of Family Medicine  [employer]

## 2017-09-24 NOTE — Assessment & Plan Note (Addendum)
We will keep an eye on his blood pressures. Checking routine blood work.

## 2017-09-24 NOTE — Assessment & Plan Note (Signed)
With hypertension and nonrestorative sleep. Adding a home sleep study

## 2017-09-24 NOTE — Assessment & Plan Note (Signed)
Patient self discontinued his Clomid, he actually felt normal off of Clomid. Rechecking testosterone levels. I did advise him that we really do not need to check this.

## 2017-09-24 NOTE — Assessment & Plan Note (Signed)
Present for 3 weeks, double sickening. Continue Flonase, adding azithromycin.

## 2017-09-25 ENCOUNTER — Other Ambulatory Visit: Payer: Self-pay | Admitting: Sports Medicine

## 2017-09-25 MED ORDER — VITAMIN D (ERGOCALCIFEROL) 1.25 MG (50000 UNIT) PO CAPS: 50000.0000 [IU] | ORAL_CAPSULE | ORAL | 0 refills | Status: DC

## 2017-09-27 ENCOUNTER — Ambulatory Visit: Payer: BLUE CROSS/BLUE SHIELD | Admitting: Osteopathic Medicine

## 2017-09-27 ENCOUNTER — Ambulatory Visit: Payer: BLUE CROSS/BLUE SHIELD | Admitting: Sports Medicine

## 2017-09-27 LAB — HEMOGLOBIN A1C
Hgb A1c MFr Bld: 5 %{Hb} (ref <5.7)
Mean Plasma Glucose: 97 (calc)
eAG (mmol/L): 5.4 (calc)

## 2017-09-27 LAB — COMPREHENSIVE METABOLIC PANEL WITH GFR
Albumin: 4.8 g/dL (ref 3.6–5.1)
BUN: 15 mg/dL (ref 7–25)
Globulin: 2.4 g/dL (ref 1.9–3.7)
Potassium: 4.2 mmol/L (ref 3.5–5.3)
Total Bilirubin: 0.9 mg/dL (ref 0.2–1.2)

## 2017-09-27 LAB — TESTOSTERONE, FREE & TOTAL
Free Testosterone: 59.6 pg/mL (ref 35.0–155.0)
Testosterone, Total, LC-MS-MS: 309 ng/dL (ref 250–1100)

## 2017-09-27 LAB — CBC
HCT: 43.6 % (ref 38.5–50.0)
Hemoglobin: 14.9 g/dL (ref 13.2–17.1)
MCH: 31 pg (ref 27.0–33.0)
MCHC: 34.2 g/dL (ref 32.0–36.0)
MCV: 90.8 fL (ref 80.0–100.0)
MPV: 9.8 fL (ref 7.5–12.5)
Platelets: 211 Thousand/uL (ref 140–400)
RBC: 4.8 Million/uL (ref 4.20–5.80)
RDW: 12.4 % (ref 11.0–15.0)
WBC: 5.3 Thousand/uL (ref 3.8–10.8)

## 2017-09-27 LAB — COMPREHENSIVE METABOLIC PANEL
AG Ratio: 2 (calc) (ref 1.0–2.5)
ALT: 30 U/L (ref 9–46)
AST: 25 U/L (ref 10–40)
Alkaline phosphatase (APISO): 78 U/L (ref 40–115)
CO2: 30 mmol/L (ref 20–32)
Calcium: 9.6 mg/dL (ref 8.6–10.3)
Chloride: 103 mmol/L (ref 98–110)
Creat: 0.87 mg/dL (ref 0.60–1.35)
Glucose, Bld: 76 mg/dL (ref 65–99)
Sodium: 138 mmol/L (ref 135–146)
Total Protein: 7.2 g/dL (ref 6.1–8.1)

## 2017-09-27 LAB — LIPID PANEL W/REFLEX DIRECT LDL
Cholesterol: 194 mg/dL (ref <200)
HDL: 35 mg/dL — ABNORMAL LOW (ref >40)
LDL Cholesterol (Calc): 127 mg/dL — ABNORMAL HIGH
Non-HDL Cholesterol (Calc): 159 mg/dL (calc) — ABNORMAL HIGH (ref <130)
Total CHOL/HDL Ratio: 5.5 (calc) — ABNORMAL HIGH (ref <5.0)
Triglycerides: 199 mg/dL — ABNORMAL HIGH (ref <150)

## 2017-09-27 LAB — HIV ANTIBODY (ROUTINE TESTING W REFLEX): HIV 1&2 Ab, 4th Generation: NONREACTIVE

## 2017-09-27 LAB — VITAMIN D 25 HYDROXY (VIT D DEFICIENCY, FRACTURES): Vit D, 25-Hydroxy: 26 ng/mL — ABNORMAL LOW (ref 30–100)

## 2017-09-27 LAB — TSH: TSH: 5.04 mIU/L — ABNORMAL HIGH (ref 0.40–4.50)

## 2017-10-09 ENCOUNTER — Encounter: Payer: BLUE CROSS/BLUE SHIELD | Admitting: Sports Medicine

## 2017-10-26 ENCOUNTER — Ambulatory Visit (INDEPENDENT_AMBULATORY_CARE_PROVIDER_SITE_OTHER): Payer: BLUE CROSS/BLUE SHIELD | Admitting: Sports Medicine

## 2017-10-26 ENCOUNTER — Other Ambulatory Visit: Payer: Self-pay

## 2017-10-26 ENCOUNTER — Encounter: Payer: Self-pay | Admitting: Sports Medicine

## 2017-10-26 DIAGNOSIS — Z0000 Encounter for general adult medical examination without abnormal findings: Secondary | ICD-10-CM

## 2017-10-26 DIAGNOSIS — I10 Essential (primary) hypertension: Secondary | ICD-10-CM | POA: Diagnosis not present

## 2017-10-26 DIAGNOSIS — R7989 Other specified abnormal findings of blood chemistry: Secondary | ICD-10-CM

## 2017-10-26 DIAGNOSIS — E291 Testicular hypofunction: Secondary | ICD-10-CM | POA: Diagnosis not present

## 2017-10-26 DIAGNOSIS — G4700 Insomnia, unspecified: Secondary | ICD-10-CM

## 2017-10-26 DIAGNOSIS — Z23 Encounter for immunization: Secondary | ICD-10-CM | POA: Diagnosis not present

## 2017-10-26 DIAGNOSIS — E781 Pure hyperglyceridemia: Secondary | ICD-10-CM

## 2017-10-26 MED ORDER — OMEGA-3-ACID ETHYL ESTERS 1 G PO CAPS: 2.0000 g | ORAL_CAPSULE | Freq: Two times a day (BID) | ORAL | 3 refills | Status: DC

## 2017-10-26 NOTE — Assessment & Plan Note (Signed)
Restarted Clomid, no changes in plan.

## 2017-10-26 NOTE — Patient Instructions (Addendum)
Check blood pressures at home and email me the numbers.  Recheck lipids, T3, T4, TSH in 2 months.

## 2017-10-26 NOTE — Progress Notes (Signed)
Subjective:    CC: Annual physical  HPI:  Elevated TSH: Recently restarted his Clomid, because this works on pituitary I wonder if this is likely responsible for his abnormal TSH.  Elevated triglycerides: Agreeable to start fish oil.  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  He now tells me that he did start to feel worse when his testosterone levels dropped, most recently they were about 300.  Previously we got him up to 4-500 with Clomid  I reviewed the past medical history, family history, social history, surgical history, and allergies today and no changes were needed.  Please see the problem list section below in epic for further details.  Past Medical History: Past Medical History:  Diagnosis Date  . Chronic prostatitis 08/04/2014  . Hypertriglyceridemia 08/04/2014  . Hypogonadism in male 08/04/2014  . Hypothyroidism 08/04/2014   Past Surgical History: Past Surgical History:  Procedure Laterality Date  . APPENDECTOMY    . SHOULDER ARTHROSCOPY    . TENDON REPAIR     Social History: Social History   Socioeconomic History  . Marital status: Married    Spouse name: None  . Number of children: None  . Years of education: None  . Highest education level: None  Social Needs  . Financial resource strain: None  . Food insecurity - worry: None  . Food insecurity - inability: None  . Transportation needs - medical: None  . Transportation needs - non-medical: None  Occupational History  . None  Tobacco Use  . Smoking status: Never Smoker  . Smokeless tobacco: Never Used  Substance and Sexual Activity  . Alcohol use: None  . Drug use: None  . Sexual activity: None  Other Topics Concern  . None  Social History Narrative  . None   Family History: Family History  Problem Relation Age of Onset  . Other Neg Hx        hypogonadism   Allergies: Allergies  Allergen Reactions  . Augmentin [Amoxicillin-Pot Clavulanate]     rash  . Ciprofloxacin Other  (See Comments)    Body aches  . Levofloxacin     Tendon Rupture  . Meperidine     Blood pressure drop  . Meperidine Hcl    Medications: See med rec.  Review of Systems: No headache, visual changes, nausea, vomiting, diarrhea, constipation, dizziness, abdominal pain, skin rash, fevers, chills, night sweats, swollen lymph nodes, weight loss, chest pain, body aches, joint swelling, muscle aches, shortness of breath, mood changes, visual or auditory hallucinations.  Objective:    General: Well Developed, well nourished, and in no acute distress.  Neuro: Alert and oriented x3, extra-ocular muscles intact, sensation grossly intact. Cranial nerves II through XII are intact, motor, sensory, and coordinative functions are all intact. HEENT: Normocephalic, atraumatic, pupils equal round reactive to light, neck supple, no masses, no lymphadenopathy, thyroid nonpalpable. Oropharynx, nasopharynx, external ear canals are unremarkable. Skin: Warm and dry, no rashes noted.  Cardiac: Regular rate and rhythm, no murmurs rubs or gallops.  Respiratory: Clear to auscultation bilaterally. Not using accessory muscles, speaking in full sentences.  Abdominal: Soft, nontender, nondistended, positive bowel sounds, no masses, no organomegaly.  Musculoskeletal: Shoulder, elbow, wrist, hip, knee, ankle stable, and with full range of motion.  Impression and Recommendations:    The patient was counselled, risk factors were discussed, anticipatory guidance given.  Annual physical exam Annual physical exam as above. Up-to-date on all screening measures.  Elevated TSH We will recheck his TSH as well as T3 and T4  in 2 months. He did recently restart Clomid, because Clomid acts on the pituitary it can alter TSH secretion.  Hypertriglyceridemia HDL is a bit low and LDL is a bit high, triglycerides are high, adding Lovaza.  Secondary male hypogonadism Restarted Clomid, no changes in plan.  Essential  hypertension Blood pressure is slightly elevated, I would like him to check it at home over the next few weeks, and report the numbers to me on my chart. ___________________________________________ Ihor Austin. Benjamin Stain, M.D., ABFM., CAQSM. Primary Care and Sports Medicine [facility]  Adjunct Instructor of Family Medicine  [employer]

## 2017-10-26 NOTE — Assessment & Plan Note (Signed)
We will recheck his TSH as well as T3 and T4 in 2 months. He did recently restart Clomid, because Clomid acts on the pituitary it can alter TSH secretion.

## 2017-10-26 NOTE — Assessment & Plan Note (Addendum)
HDL is a bit low and LDL is a bit high, triglycerides are high, adding Lovaza.

## 2017-10-26 NOTE — Assessment & Plan Note (Signed)
Annual physical exam as above. Up-to-date on all screening measures. 

## 2017-10-26 NOTE — Assessment & Plan Note (Signed)
Blood pressure is slightly elevated, I would like him to check it at home over the next few weeks, and report the numbers to me on my chart.

## 2017-11-17 ENCOUNTER — Other Ambulatory Visit: Payer: Self-pay | Admitting: Sports Medicine

## 2017-11-20 ENCOUNTER — Other Ambulatory Visit: Payer: Self-pay | Admitting: Sports Medicine

## 2017-11-20 DIAGNOSIS — E291 Testicular hypofunction: Secondary | ICD-10-CM

## 2018-01-14 ENCOUNTER — Other Ambulatory Visit: Payer: Self-pay | Admitting: Sports Medicine

## 2018-01-18 ENCOUNTER — Other Ambulatory Visit: Payer: Self-pay | Admitting: Sports Medicine

## 2018-01-18 DIAGNOSIS — E291 Testicular hypofunction: Secondary | ICD-10-CM

## 2018-01-24 ENCOUNTER — Encounter: Payer: Self-pay | Admitting: Sports Medicine

## 2018-01-24 ENCOUNTER — Ambulatory Visit: Payer: BLUE CROSS/BLUE SHIELD | Admitting: Sports Medicine

## 2018-01-24 ENCOUNTER — Ambulatory Visit (INDEPENDENT_AMBULATORY_CARE_PROVIDER_SITE_OTHER): Payer: BLUE CROSS/BLUE SHIELD

## 2018-01-24 DIAGNOSIS — M25551 Pain in right hip: Secondary | ICD-10-CM

## 2018-01-24 DIAGNOSIS — M1611 Unilateral primary osteoarthritis, right hip: Secondary | ICD-10-CM | POA: Insufficient documentation

## 2018-01-24 MED ORDER — PREDNISONE 50 MG PO TABS
ORAL_TABLET | ORAL | 0 refills | Status: DC
Start: 2018-01-24 — End: 2018-02-26

## 2018-01-24 NOTE — Assessment & Plan Note (Signed)
There is a combination of right sacroiliac joint mediated pain and right hip joint mediated pain. X-rays of the SI joints, hip. 5 days of prednisone, he can use some of his wife's ibuprofen 800. Rehab exercises for the hip and SI joint and return to see me in 4 weeks, injections if no better.

## 2018-01-24 NOTE — Progress Notes (Signed)
Subjective:    CC: Right hip pain  HPI: This is a pleasant 52 year old male, for the past several days he said increasing pain in his right hip both at the groin as well as on the back near the sacroiliac joint.  He has been working out more, playing more hockey, pain is moderate, persistent, he does have significant gelling with regards to his anterior pain.  No bowel or bladder dysfunction, saddle numbness, constitutional symptoms, no trauma.  I reviewed the past medical history, family history, social history, surgical history, and allergies today and no changes were needed.  Please see the problem list section below in epic for further details.  Past Medical History: Past Medical History:  Diagnosis Date  . Chronic prostatitis 08/04/2014  . Hypertriglyceridemia 08/04/2014  . Hypogonadism in male 08/04/2014  . Hypothyroidism 08/04/2014   Past Surgical History: Past Surgical History:  Procedure Laterality Date  . APPENDECTOMY    . SHOULDER ARTHROSCOPY    . TENDON REPAIR     Social History: Social History   Socioeconomic History  . Marital status: Married    Spouse name: Not on file  . Number of children: Not on file  . Years of education: Not on file  . Highest education level: Not on file  Occupational History  . Not on file  Social Needs  . Financial resource strain: Not on file  . Food insecurity:    Worry: Not on file    Inability: Not on file  . Transportation needs:    Medical: Not on file    Non-medical: Not on file  Tobacco Use  . Smoking status: Never Smoker  . Smokeless tobacco: Never Used  Substance and Sexual Activity  . Alcohol use: Not on file  . Drug use: Not on file  . Sexual activity: Not on file  Lifestyle  . Physical activity:    Days per week: Not on file    Minutes per session: Not on file  . Stress: Not on file  Relationships  . Social connections:    Talks on phone: Not on file    Gets together: Not on file    Attends religious  service: Not on file    Active member of club or organization: Not on file    Attends meetings of clubs or organizations: Not on file    Relationship status: Not on file  Other Topics Concern  . Not on file  Social History Narrative  . Not on file   Family History: Family History  Problem Relation Age of Onset  . Other Neg Hx        hypogonadism   Allergies: Allergies  Allergen Reactions  . Augmentin [Amoxicillin-Pot Clavulanate]     rash  . Ciprofloxacin Other (See Comments)    Body aches  . Levofloxacin     Tendon Rupture  . Meperidine     Blood pressure drop  . Meperidine Hcl    Medications: See med rec.  Review of Systems: No fevers, chills, night sweats, weight loss, chest pain, or shortness of breath.   Objective:    General: Well Developed, well nourished, and in no acute distress.  Neuro: Alert and oriented x3, extra-ocular muscles intact, sensation grossly intact.  HEENT: Normocephalic, atraumatic, pupils equal round reactive to light, neck supple, no masses, no lymphadenopathy, thyroid nonpalpable.  Skin: Warm and dry, no rashes. Cardiac: Regular rate and rhythm, no murmurs rubs or gallops, no lower extremity edema.  Respiratory: Clear to auscultation  bilaterally. Not using accessory muscles, speaking in full sentences. Right hip: ROM IR: 30 degrees with severe pain, ER: 60 Deg, Flexion: 120 Deg, Extension: 100 Deg, Abduction: 45 Deg, Adduction: 45 Deg Strength IR: 5/5, ER: 5/5, Flexion: 5/5, Extension: 5/5, Abduction: 5/5, Adduction: 5/5 Pelvic alignment unremarkable to inspection and palpation. Standing hip rotation and gait without trendelenburg / unsteadiness. Greater trochanter without tenderness to palpation. No tenderness over piriformis. Severe tenderness over the right sacroiliac joint with reproduction of referred pain down the posterior lateral thigh  Impression and Recommendations:    Acute right hip pain There is a combination of right  sacroiliac joint mediated pain and right hip joint mediated pain. X-rays of the SI joints, hip. 5 days of prednisone, he can use some of his wife's ibuprofen 800. Rehab exercises for the hip and SI joint and return to see me in 4 weeks, injections if no better. ___________________________________________ Ihor Austin. Benjamin Stain, M.D., ABFM., CAQSM. Primary Care and Sports Medicine [facility]  Adjunct Instructor of Family Medicine  [employer]

## 2018-02-16 ENCOUNTER — Other Ambulatory Visit: Payer: Self-pay | Admitting: Sports Medicine

## 2018-02-16 DIAGNOSIS — M5441 Lumbago with sciatica, right side: Secondary | ICD-10-CM | POA: Diagnosis not present

## 2018-02-16 DIAGNOSIS — M9905 Segmental and somatic dysfunction of pelvic region: Secondary | ICD-10-CM | POA: Diagnosis not present

## 2018-02-16 DIAGNOSIS — M461 Sacroiliitis, not elsewhere classified: Secondary | ICD-10-CM | POA: Diagnosis not present

## 2018-02-16 DIAGNOSIS — M9903 Segmental and somatic dysfunction of lumbar region: Secondary | ICD-10-CM | POA: Diagnosis not present

## 2018-02-16 DIAGNOSIS — M9904 Segmental and somatic dysfunction of sacral region: Secondary | ICD-10-CM | POA: Diagnosis not present

## 2018-02-16 DIAGNOSIS — M1991 Primary osteoarthritis, unspecified site: Secondary | ICD-10-CM | POA: Diagnosis not present

## 2018-02-16 DIAGNOSIS — E291 Testicular hypofunction: Secondary | ICD-10-CM

## 2018-02-16 DIAGNOSIS — M7611 Psoas tendinitis, right hip: Secondary | ICD-10-CM | POA: Diagnosis not present

## 2018-02-19 DIAGNOSIS — M9905 Segmental and somatic dysfunction of pelvic region: Secondary | ICD-10-CM | POA: Diagnosis not present

## 2018-02-19 DIAGNOSIS — M9904 Segmental and somatic dysfunction of sacral region: Secondary | ICD-10-CM | POA: Diagnosis not present

## 2018-02-19 DIAGNOSIS — M461 Sacroiliitis, not elsewhere classified: Secondary | ICD-10-CM | POA: Diagnosis not present

## 2018-02-19 DIAGNOSIS — M1991 Primary osteoarthritis, unspecified site: Secondary | ICD-10-CM | POA: Diagnosis not present

## 2018-02-23 DIAGNOSIS — M1991 Primary osteoarthritis, unspecified site: Secondary | ICD-10-CM | POA: Diagnosis not present

## 2018-02-23 DIAGNOSIS — M9904 Segmental and somatic dysfunction of sacral region: Secondary | ICD-10-CM | POA: Diagnosis not present

## 2018-02-23 DIAGNOSIS — M461 Sacroiliitis, not elsewhere classified: Secondary | ICD-10-CM | POA: Diagnosis not present

## 2018-02-23 DIAGNOSIS — M9905 Segmental and somatic dysfunction of pelvic region: Secondary | ICD-10-CM | POA: Diagnosis not present

## 2018-02-26 ENCOUNTER — Ambulatory Visit: Payer: BLUE CROSS/BLUE SHIELD | Admitting: Sports Medicine

## 2018-02-26 ENCOUNTER — Encounter: Payer: Self-pay | Admitting: Sports Medicine

## 2018-02-26 ENCOUNTER — Ambulatory Visit (INDEPENDENT_AMBULATORY_CARE_PROVIDER_SITE_OTHER): Payer: BLUE CROSS/BLUE SHIELD

## 2018-02-26 DIAGNOSIS — M5136 Other intervertebral disc degeneration, lumbar region: Secondary | ICD-10-CM

## 2018-02-26 DIAGNOSIS — M1611 Unilateral primary osteoarthritis, right hip: Secondary | ICD-10-CM

## 2018-02-26 DIAGNOSIS — M545 Low back pain: Secondary | ICD-10-CM

## 2018-02-26 DIAGNOSIS — E291 Testicular hypofunction: Secondary | ICD-10-CM

## 2018-02-26 DIAGNOSIS — M25559 Pain in unspecified hip: Secondary | ICD-10-CM

## 2018-02-26 DIAGNOSIS — M1991 Primary osteoarthritis, unspecified site: Secondary | ICD-10-CM | POA: Diagnosis not present

## 2018-02-26 DIAGNOSIS — M9904 Segmental and somatic dysfunction of sacral region: Secondary | ICD-10-CM | POA: Diagnosis not present

## 2018-02-26 DIAGNOSIS — G4700 Insomnia, unspecified: Secondary | ICD-10-CM | POA: Diagnosis not present

## 2018-02-26 DIAGNOSIS — M51369 Other intervertebral disc degeneration, lumbar region without mention of lumbar back pain or lower extremity pain: Secondary | ICD-10-CM | POA: Insufficient documentation

## 2018-02-26 DIAGNOSIS — M9905 Segmental and somatic dysfunction of pelvic region: Secondary | ICD-10-CM | POA: Diagnosis not present

## 2018-02-26 DIAGNOSIS — M7061 Trochanteric bursitis, right hip: Secondary | ICD-10-CM | POA: Diagnosis not present

## 2018-02-26 DIAGNOSIS — M461 Sacroiliitis, not elsewhere classified: Secondary | ICD-10-CM | POA: Diagnosis not present

## 2018-02-26 MED ORDER — CLOMIPHENE CITRATE 50 MG PO TABS: 25.0000 mg | ORAL_TABLET | Freq: Every day | ORAL | 0 refills | Status: DC

## 2018-02-26 MED ORDER — ALPRAZOLAM 0.5 MG PO TABS: 0.5000 mg | ORAL_TABLET | Freq: Every evening | ORAL | 0 refills | Status: DC | PRN

## 2018-02-26 NOTE — Assessment & Plan Note (Signed)
Rehab exercises given, return in 1 month, injection if no better.

## 2018-02-26 NOTE — Assessment & Plan Note (Signed)
Pain completely resolved 

## 2018-02-26 NOTE — Assessment & Plan Note (Signed)
Axial discogenic back pain. X-rays, rehab exercises, return in 1 month, MR for interventional planning if no better. We did discuss the pathophysiology of degenerative disc disease.

## 2018-02-26 NOTE — Progress Notes (Signed)
Subjective:    CC: Recheck hip pain  HPI: Right hip pain: Persistent, localized in the groin, initially diagnosis hip osteoarthritis, confirmed on x-rays, this is resolved.  He now has pain that he localizes in the lateral aspect of the right hip, moderate, persistent, localized without radiation.  Also has pain in the midline of his low back, worse with sitting, flexion, Valsalva, nothing radicular, no bowel or bladder dysfunction, saddle numbness, constitutional symptoms.  I reviewed the past medical history, family history, social history, surgical history, and allergies today and no changes were needed.  Please see the problem list section below in epic for further details.  Past Medical History: Past Medical History:  Diagnosis Date  . Chronic prostatitis 08/04/2014  . Hypertriglyceridemia 08/04/2014  . Hypogonadism in male 08/04/2014  . Hypothyroidism 08/04/2014   Past Surgical History: Past Surgical History:  Procedure Laterality Date  . APPENDECTOMY    . SHOULDER ARTHROSCOPY    . TENDON REPAIR     Social History: Social History   Socioeconomic History  . Marital status: Married    Spouse name: Not on file  . Number of children: Not on file  . Years of education: Not on file  . Highest education level: Not on file  Occupational History  . Not on file  Social Needs  . Financial resource strain: Not on file  . Food insecurity:    Worry: Not on file    Inability: Not on file  . Transportation needs:    Medical: Not on file    Non-medical: Not on file  Tobacco Use  . Smoking status: Never Smoker  . Smokeless tobacco: Never Used  Substance and Sexual Activity  . Alcohol use: Not on file  . Drug use: Not on file  . Sexual activity: Not on file  Lifestyle  . Physical activity:    Days per week: Not on file    Minutes per session: Not on file  . Stress: Not on file  Relationships  . Social connections:    Talks on phone: Not on file    Gets together: Not  on file    Attends religious service: Not on file    Active member of club or organization: Not on file    Attends meetings of clubs or organizations: Not on file    Relationship status: Not on file  Other Topics Concern  . Not on file  Social History Narrative  . Not on file   Family History: Family History  Problem Relation Age of Onset  . Other Neg Hx        hypogonadism   Allergies: Allergies  Allergen Reactions  . Augmentin [Amoxicillin-Pot Clavulanate]     rash  . Ciprofloxacin Other (See Comments)    Body aches  . Levofloxacin     Tendon Rupture  . Meperidine     Blood pressure drop  . Meperidine Hcl    Medications: See med rec.  Review of Systems: No fevers, chills, night sweats, weight loss, chest pain, or shortness of breath.   Objective:    General: Well Developed, well nourished, and in no acute distress.  Neuro: Alert and oriented x3, extra-ocular muscles intact, sensation grossly intact.  HEENT: Normocephalic, atraumatic, pupils equal round reactive to light, neck supple, no masses, no lymphadenopathy, thyroid nonpalpable.  Skin: Warm and dry, no rashes. Cardiac: Regular rate and rhythm, no murmurs rubs or gallops, no lower extremity edema.  Respiratory: Clear to auscultation bilaterally. Not using accessory  muscles, speaking in full sentences. Right hip: ROM IR: 60 Deg, ER: 60 Deg, Flexion: 120 Deg, Extension: 100 Deg, Abduction: 45 Deg, Adduction: 45 Deg Strength IR: 5/5, ER: 5/5, Flexion: 5/5, Extension: 5/5, Abduction: 5/5, Adduction: 5/5 Pelvic alignment unremarkable to inspection and palpation. Standing hip rotation and gait without trendelenburg / unsteadiness. Greater trochanter with mild tenderness to palpation. No tenderness over piriformis. No SI joint tenderness and normal minimal SI movement. Back Exam:  Inspection: Unremarkable  Motion: Flexion 45 deg, Extension 45 deg, Side Bending to 45 deg bilaterally,  Rotation to 45 deg  bilaterally  SLR laying: Negative  XSLR laying: Negative  Palpable tenderness: None. FABER: negative. Sensory change: Gross sensation intact to all lumbar and sacral dermatomes.  Reflexes: 2+ at both patellar tendons, 2+ at achilles tendons, Babinski's downgoing.  Strength at foot  Plantar-flexion: 5/5 Dorsi-flexion: 5/5 Eversion: 5/5 Inversion: 5/5  Leg strength  Quad: 5/5 Hamstring: 5/5 Hip flexor: 5/5 Hip abductors: 5/5  Gait unremarkable.  Impression and Recommendations:    Primary osteoarthritis of right hip Pain completely resolved.  Trochanteric bursitis, right hip Rehab exercises given, return in 1 month, injection if no better.  Lumbar degenerative disc disease Axial discogenic back pain. X-rays, rehab exercises, return in 1 month, MR for interventional planning if no better. We did discuss the pathophysiology of degenerative disc disease. ___________________________________________ Ihor Austin. Benjamin Stain, M.D., ABFM., CAQSM. Primary Care and Sports Medicine [facility]  Adjunct Instructor of Family Medicine  [employer]

## 2018-03-02 DIAGNOSIS — M461 Sacroiliitis, not elsewhere classified: Secondary | ICD-10-CM | POA: Diagnosis not present

## 2018-03-02 DIAGNOSIS — M1991 Primary osteoarthritis, unspecified site: Secondary | ICD-10-CM | POA: Diagnosis not present

## 2018-03-02 DIAGNOSIS — M9905 Segmental and somatic dysfunction of pelvic region: Secondary | ICD-10-CM | POA: Diagnosis not present

## 2018-03-02 DIAGNOSIS — M9904 Segmental and somatic dysfunction of sacral region: Secondary | ICD-10-CM | POA: Diagnosis not present

## 2018-03-08 DIAGNOSIS — M9904 Segmental and somatic dysfunction of sacral region: Secondary | ICD-10-CM | POA: Diagnosis not present

## 2018-03-08 DIAGNOSIS — M461 Sacroiliitis, not elsewhere classified: Secondary | ICD-10-CM | POA: Diagnosis not present

## 2018-03-08 DIAGNOSIS — M1991 Primary osteoarthritis, unspecified site: Secondary | ICD-10-CM | POA: Diagnosis not present

## 2018-03-08 DIAGNOSIS — M9905 Segmental and somatic dysfunction of pelvic region: Secondary | ICD-10-CM | POA: Diagnosis not present

## 2018-03-11 DIAGNOSIS — M1991 Primary osteoarthritis, unspecified site: Secondary | ICD-10-CM | POA: Diagnosis not present

## 2018-03-11 DIAGNOSIS — M461 Sacroiliitis, not elsewhere classified: Secondary | ICD-10-CM | POA: Diagnosis not present

## 2018-03-11 DIAGNOSIS — M9904 Segmental and somatic dysfunction of sacral region: Secondary | ICD-10-CM | POA: Diagnosis not present

## 2018-03-11 DIAGNOSIS — M9905 Segmental and somatic dysfunction of pelvic region: Secondary | ICD-10-CM | POA: Diagnosis not present

## 2018-03-12 ENCOUNTER — Encounter: Payer: Self-pay | Admitting: Sports Medicine

## 2018-03-12 ENCOUNTER — Ambulatory Visit: Payer: BLUE CROSS/BLUE SHIELD | Admitting: Sports Medicine

## 2018-03-12 DIAGNOSIS — M7061 Trochanteric bursitis, right hip: Secondary | ICD-10-CM | POA: Diagnosis not present

## 2018-03-12 DIAGNOSIS — M5136 Other intervertebral disc degeneration, lumbar region: Secondary | ICD-10-CM | POA: Diagnosis not present

## 2018-03-12 DIAGNOSIS — M51369 Other intervertebral disc degeneration, lumbar region without mention of lumbar back pain or lower extremity pain: Secondary | ICD-10-CM

## 2018-03-12 NOTE — Progress Notes (Signed)
Subjective:    CC: Recheck right-sided hip and back pain  HPI: Is, his right sided anterior groin pain has since resolved, still has pain that he localizes in his back, over the right sacroiliac joint as well as the lateral hip.  Worse when taking steps, going up and down stairs.  Also has some pain in the midline of his low back with radiation down the right leg to the dorsum of the right foot.  No bowel or bladder dysfunction, saddle numbness, constitutional symptoms.  I reviewed the past medical history, family history, social history, surgical history, and allergies today and no changes were needed.  Please see the problem list section below in epic for further details.  Past Medical History: Past Medical History:  Diagnosis Date  . Chronic prostatitis 08/04/2014  . Hypertriglyceridemia 08/04/2014  . Hypogonadism in male 08/04/2014  . Hypothyroidism 08/04/2014   Past Surgical History: Past Surgical History:  Procedure Laterality Date  . APPENDECTOMY    . SHOULDER ARTHROSCOPY    . TENDON REPAIR     Social History: Social History   Socioeconomic History  . Marital status: Married    Spouse name: Not on file  . Number of children: Not on file  . Years of education: Not on file  . Highest education level: Not on file  Occupational History  . Not on file  Social Needs  . Financial resource strain: Not on file  . Food insecurity:    Worry: Not on file    Inability: Not on file  . Transportation needs:    Medical: Not on file    Non-medical: Not on file  Tobacco Use  . Smoking status: Never Smoker  . Smokeless tobacco: Never Used  Substance and Sexual Activity  . Alcohol use: Not on file  . Drug use: Not on file  . Sexual activity: Not on file  Lifestyle  . Physical activity:    Days per week: Not on file    Minutes per session: Not on file  . Stress: Not on file  Relationships  . Social connections:    Talks on phone: Not on file    Gets together: Not on file     Attends religious service: Not on file    Active member of club or organization: Not on file    Attends meetings of clubs or organizations: Not on file    Relationship status: Not on file  Other Topics Concern  . Not on file  Social History Narrative  . Not on file   Family History: Family History  Problem Relation Age of Onset  . Other Neg Hx        hypogonadism   Allergies: Allergies  Allergen Reactions  . Augmentin [Amoxicillin-Pot Clavulanate]     rash  . Ciprofloxacin Other (See Comments)    Body aches  . Levofloxacin     Tendon Rupture  . Meperidine     Blood pressure drop  . Meperidine Hcl    Medications: See med rec.  Review of Systems: No fevers, chills, night sweats, weight loss, chest pain, or shortness of breath.   Objective:    General: Well Developed, well nourished, and in no acute distress.  Neuro: Alert and oriented x3, extra-ocular muscles intact, sensation grossly intact.  HEENT: Normocephalic, atraumatic, pupils equal round reactive to light, neck supple, no masses, no lymphadenopathy, thyroid nonpalpable.  Skin: Warm and dry, no rashes. Cardiac: Regular rate and rhythm, no murmurs rubs or gallops, no  lower extremity edema.  Respiratory: Clear to auscultation bilaterally. Not using accessory muscles, speaking in full sentences. Back Exam:  Inspection: Unremarkable  Motion: Flexion 45 deg, Extension 45 deg, Side Bending to 45 deg bilaterally,  Rotation to 45 deg bilaterally  SLR laying: Negative  XSLR laying: Negative  Palpable tenderness: Right sacroiliac joint. FABER: negative. Sensory change: Gross sensation intact to all lumbar and sacral dermatomes.  Reflexes: 2+ at both patellar tendons, 2+ at achilles tendons, Babinski's downgoing.  Strength at foot  Plantar-flexion: 5/5 Dorsi-flexion: 5/5 Eversion: 5/5 Inversion: 5/5  Leg strength  Quad: 5/5 Hamstring: 5/5 Hip flexor: 5/5 Hip abductors: 5/5  Gait unremarkable. Mild tenderness over  the right greater trochanteric bursa  Procedure: Real-time Ultrasound Guided Injection of right sacroiliac joint Device: GE Logiq E  Verbal informed consent obtained.  Time-out conducted.  Noted no overlying erythema, induration, or other signs of local infection.  Skin prepped in a sterile fashion.  Local anesthesia: Topical Ethyl chloride.  With sterile technique and under real time ultrasound guidance: Taking care to avoid the S1 foramen I dropped a 22-gauge spinal needle into the sacroiliac joint and injected 1 cc kenalog 40, 2 cc lidocaine, 2 cc bupivacaine Completed without difficulty  SI joint pain immediately resolved, he still had some pain over the lateral hip Advised to call if fevers/chills, erythema, induration, drainage, or persistent bleeding.  Images permanently stored and available for review in the ultrasound unit.  Impression: Technically successful ultrasound guided injection.  Impression and Recommendations:    Lumbar degenerative disc disease With classic right L5 radiculopathy. He also has some right sacroiliac joint mediated pain, this was injected today. MRI ordered. Return to go over MRI results, if still having radicular pain down to the foot we will add an epidural as well. SI joint pain will traditionally refer pain to the lateral upper hip, buttock, and posterior thigh but not past the knee.  Trochanteric bursitis, right hip SI joint pain resolved after injection but still has trochanteric bursa mediated pain. Continue to work hard on the rehab exercises over the next month and we will shoot the trochanteric bursa if no improvement at the follow-up visit.  ___________________________________________ Ihor Austinhomas J. Benjamin Stainhekkekandam, M.D., ABFM., CAQSM. Primary Care and Sports Medicine [facility]  Adjunct Instructor of Family Medicine  [employer]

## 2018-03-12 NOTE — Assessment & Plan Note (Signed)
SI joint pain resolved after injection but still has trochanteric bursa mediated pain. Continue to work hard on the rehab exercises over the next month and we will shoot the trochanteric bursa if no improvement at the follow-up visit.

## 2018-03-12 NOTE — Assessment & Plan Note (Signed)
With classic right L5 radiculopathy. He also has some right sacroiliac joint mediated pain, this was injected today. MRI ordered. Return to go over MRI results, if still having radicular pain down to the foot we will add an epidural as well. SI joint pain will traditionally refer pain to the lateral upper hip, buttock, and posterior thigh but not past the knee.

## 2018-03-17 ENCOUNTER — Encounter: Payer: Self-pay | Admitting: Sports Medicine

## 2018-03-17 DIAGNOSIS — M1611 Unilateral primary osteoarthritis, right hip: Secondary | ICD-10-CM

## 2018-03-17 DIAGNOSIS — M7061 Trochanteric bursitis, right hip: Secondary | ICD-10-CM

## 2018-03-25 ENCOUNTER — Ambulatory Visit (INDEPENDENT_AMBULATORY_CARE_PROVIDER_SITE_OTHER): Payer: BLUE CROSS/BLUE SHIELD

## 2018-03-25 DIAGNOSIS — M5136 Other intervertebral disc degeneration, lumbar region: Secondary | ICD-10-CM

## 2018-03-25 DIAGNOSIS — M4807 Spinal stenosis, lumbosacral region: Secondary | ICD-10-CM | POA: Diagnosis not present

## 2018-03-25 DIAGNOSIS — M4727 Other spondylosis with radiculopathy, lumbosacral region: Secondary | ICD-10-CM | POA: Diagnosis not present

## 2018-04-01 ENCOUNTER — Ambulatory Visit (INDEPENDENT_AMBULATORY_CARE_PROVIDER_SITE_OTHER): Payer: BLUE CROSS/BLUE SHIELD

## 2018-04-01 DIAGNOSIS — M1611 Unilateral primary osteoarthritis, right hip: Secondary | ICD-10-CM | POA: Diagnosis not present

## 2018-04-08 ENCOUNTER — Other Ambulatory Visit: Payer: BLUE CROSS/BLUE SHIELD

## 2018-04-22 DIAGNOSIS — M5416 Radiculopathy, lumbar region: Secondary | ICD-10-CM | POA: Diagnosis not present

## 2018-04-22 DIAGNOSIS — M1611 Unilateral primary osteoarthritis, right hip: Secondary | ICD-10-CM | POA: Diagnosis not present

## 2018-05-11 DIAGNOSIS — G5731 Lesion of lateral popliteal nerve, right lower limb: Secondary | ICD-10-CM | POA: Diagnosis not present

## 2018-05-21 DIAGNOSIS — M533 Sacrococcygeal disorders, not elsewhere classified: Secondary | ICD-10-CM | POA: Diagnosis not present

## 2018-05-21 DIAGNOSIS — M9904 Segmental and somatic dysfunction of sacral region: Secondary | ICD-10-CM | POA: Diagnosis not present

## 2018-05-22 ENCOUNTER — Other Ambulatory Visit: Payer: Self-pay | Admitting: Sports Medicine

## 2018-05-22 DIAGNOSIS — E291 Testicular hypofunction: Secondary | ICD-10-CM

## 2018-05-28 ENCOUNTER — Ambulatory Visit: Payer: BLUE CROSS/BLUE SHIELD | Admitting: Physical Therapy

## 2018-05-28 ENCOUNTER — Encounter: Payer: Self-pay | Admitting: Physical Therapy

## 2018-05-28 DIAGNOSIS — G8929 Other chronic pain: Secondary | ICD-10-CM

## 2018-05-28 DIAGNOSIS — R252 Cramp and spasm: Secondary | ICD-10-CM | POA: Diagnosis not present

## 2018-05-28 DIAGNOSIS — M5441 Lumbago with sciatica, right side: Secondary | ICD-10-CM | POA: Diagnosis not present

## 2018-05-28 NOTE — Therapy (Signed)
[facility] [place] [address] [place], [place], [address] Phone: [phone]   Fax:  [phone]  Physical Therapy Evaluation  Patient Details  Name: Casey Elliott MRN: [id number] Date of Birth: 04/09/1966 Referring Provider: Dr. Venita Lick   Encounter Date: 05/28/2018  PT End of Session - 05/28/18 0850    Visit Number  1    Number of Visits  12    Date for PT Re-Evaluation  07/09/18    Authorization Type  BCBS    PT Start Time  0803    PT Stop Time  0842    PT Time Calculation (min)  39 min    Activity Tolerance  Patient tolerated treatment well    Behavior During Therapy  [facility] for tasks assessed/performed       Past Medical History:  Diagnosis Date  . Chronic prostatitis 08/04/2014  . Hypertriglyceridemia 08/04/2014  . Hypogonadism in male 08/04/2014  . Hypothyroidism 08/04/2014    Past Surgical History:  Procedure Laterality Date  . APPENDECTOMY    . SHOULDER ARTHROSCOPY    . TENDON REPAIR      There were no vitals filed for this visit.   Subjective Assessment - 05/28/18 0806    Subjective  Pt is a 52 y/o male who presents to OPPT for Rt SIJ pain resulting in back and hip pain since April 2019.  Pt without known injury but reports began when trying to return to gym.  Pt had single SI injection in June 2019 with relief x 1 day.  Pt now scheduled for another SI injection on 06/04/18.  Pt presents today with difficulty sitting, negotiating up stairs/hills, and c/o cramping and spasms in glutes.    Limitations  Sitting;Walking    How long can you sit comfortably?  few hours    How long can you walk comfortably?  after ~ 30-75 min c/o RLE going "numb"    Patient Stated Goals  improve pain    Currently in Pain?  Yes    Pain Score  5    at best 4/10; worst 10/10   Pain Location  Back    Pain Orientation  Right    Pain Descriptors / Indicators  Sharp;Sore;Aching;Burning;Spasm    Pain Type  Chronic pain;Acute  pain    Pain Onset  More than a month ago    Pain Frequency  Constant    Aggravating Factors   sitting, hills/stairs, driving    Pain Relieving Factors  ibuprofen         [facility] PT Assessment - 05/28/18 0811      Assessment   Medical Diagnosis  Rt SI joint dysfunction    Referring Provider  Dr. Venita Lick    Onset Date/Surgical Date  --   April 2019   Next MD Visit  5 weeks    Prior Therapy  none related to this condition      Precautions   Precautions  None      Restrictions   Weight Bearing Restrictions  No      Balance Screen   Has the patient fallen in the past 6 months  No    Has the patient had a decrease in activity level because of a fear of falling?   No    Is the patient reluctant to leave their home because of a fear of falling?   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[employer]  hockey, walking, gym (unable to go at this time)      Cognition   Overall Cognitive Status  Within Functional Limits for tasks assessed      Observation/Other Assessments   Focus on Therapeutic Outcomes (FOTO)   49 (51% limited; predicted 33% limited)      Posture/Postural Control   Posture/Postural Control  Postural limitations    Postural Limitations  Decreased lumbar lordosis      ROM / Strength   AROM / PROM / Strength  AROM;Strength      AROM   Overall AROM Comments  lumbar ROM  WNL with c/o tightness      Strength   Strength Assessment Site  Hip;Knee;Ankle    Right/Left Hip  Right;Left    Right Hip Flexion  5/5    Right Hip Extension  4/5    Right Hip ABduction  3/5    Left Hip Flexion  5/5    Left Hip Extension  5/5    Left Hip ABduction  3+/5    Right/Left Knee  Right;Left    Right Knee Extension  5/5    Left Knee Extension  5/5    Right/Left Ankle  Right;Left    Right Ankle Dorsiflexion  5/5    Left Ankle Dorsiflexion  5/5      Flexibility   Soft Tissue Assessment /Muscle Length  yes    Hamstrings  tightness Rt>Lt    Piriformis  tightness Rt>Lt      Special Tests    Special Tests  Sacrolliac Tests    Sacroiliac Tests   Sacral Thrust      Sacral thrust    Findings  Negative      Ambulation/Gait   Gait Comments  amb with mild antalgic gait                Objective measurements completed on examination: See above findings.      [facility] Adult PT Treatment/Exercise - 05/28/18 0811      Exercises   Exercises  Lumbar      Lumbar Exercises: Stretches   Passive Hamstring Stretch  Right;1 rep;30 seconds    Single Knee to Chest Stretch  Right;1 rep;30 seconds    Double Knee to Chest Stretch  1 rep;30 seconds    Piriformis Stretch  Right;1 rep;30 seconds      Lumbar Exercises: Sidelying   Hip Abduction  Right;5 reps    Hip Abduction Limitations  cues needed for proper form             PT Education - 05/28/18 0850    Education Details  HEP, DN    Person(s) Educated  Patient    Methods  Explanation;Demonstration;Handout    Comprehension  Verbalized understanding;Returned demonstration  PT Long Term Goals - 05/28/18 1020      PT LONG TERM GOAL #1   Title  independent with HEP    Status  New    Target Date  07/09/18      PT LONG TERM GOAL #2   Title  FOTO score improved to </= 35% limited for improved function    Status  New    Target Date  07/09/18      PT LONG TERM GOAL #3   Title  report ability to  amb > 45 min without radicular symptoms for improved function    Status  New    Target Date  07/09/18      PT LONG TERM GOAL #4   Title  demonstrate at least 4/5 hip abduction strength for improved function    Status  New    Target Date  07/09/18      PT LONG TERM GOAL #5   Title  report pain < 4/10 with walking/exercise for improved function    Status  New    Target Date  07/09/18             Plan - 05/28/18 0841    Clinical Impression Statement  Pt is a 52 y/o male who presents to OPPT for Rt SIJ and glute pain radiating down RLE.  Pt demonstrates active trigger points in glutes and piriformis as well as decreased flexibility RLE.  Pt has mild strength deficits especially Rt hip abduction and extension and will benefit from PT to address deficits listed.    Clinical Presentation  Stable    Clinical Decision Making  Low    Rehab Potential  Good    PT Frequency  2x / week    PT Duration  6 weeks    PT Treatment/Interventions  ADLs/Self Care Home Management;Cryotherapy;Electrical Stimulation;Iontophoresis 4mg /ml Dexamethasone;Moist Heat;Traction;Therapeutic exercise;Therapeutic activities;Functional mobility training;Stair training;Gait training;Ultrasound;Patient/family education;Manual techniques;Taping;Dry needling;Passive range of motion    PT Next Visit Plan  review HEP, DN/manual therapy to Rt glutes/piriformis; modalities PRN    PT Home Exercise Plan  Access Code: [email or web address]    Consulted and Agree with Plan of Care  Patient       Patient will benefit from skilled therapeutic intervention in order to improve the following deficits and impairments:  Abnormal gait, Pain, Increased fascial restricitons, Increased muscle spasms, Decreased strength, Impaired flexibility, Postural dysfunction, Difficulty walking  Visit Diagnosis: Chronic right-sided low back pain with right-sided sciatica - Plan: PT plan of care cert/re-cert  Cramp and spasm - Plan: PT plan of care  cert/re-cert     Problem List Patient Active Problem List   Diagnosis Date Noted  . Trochanteric bursitis, right hip 02/26/2018  . Lumbar degenerative disc disease 02/26/2018  . Primary osteoarthritis of right hip 01/24/2018  . Annual physical exam 09/24/2017  . Snoring 09/24/2017  . Palpitations 02/09/2015  . Essential hypertension 01/25/2015  . Insomnia 11/17/2014  . Hypertriglyceridemia 08/04/2014  . Elevated TSH 08/04/2014  . 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 Chronic prostatitis 08/04/2014      Clarita Crane, PT, DPT 05/28/18 10:28 AM    [facility] [address] [place], [place], [address] Phone: [phone]   Fax:  [phone]  Name: Casey Elliott MRN: [id number] Date of Birth: Jun 01, 1966

## 2018-05-28 NOTE — Patient Instructions (Signed)
Access Code: [email or web address]  URL: [email or web address]  Date: 05/28/2018  Prepared by: Moshe Cipro   Exercises  Sidelying Hip Abduction - 10 reps - 3 sets - 1x daily - 7x weekly  Supine Piriformis Stretch with Foot on Ground - 3 reps - 1 sets - 30 sec hold - 2x daily - 7x weekly  Hooklying Single Knee to Chest - 3 reps - 1 sets - 30 sec hold - 2x daily - 7x weekly  Supine Double Knee to Chest - 3 reps - 1 sets - 30 sec hold - 2x daily - 7x weekly  Supine Hamstring Stretch with Strap - 3 reps - 1 sets - 30 sec hold - 2x daily - 7x weekly  Patient Education  Trigger Point Dry Needling

## 2018-05-31 ENCOUNTER — Ambulatory Visit: Payer: BLUE CROSS/BLUE SHIELD | Admitting: Physical Therapy

## 2018-05-31 ENCOUNTER — Encounter: Payer: Self-pay | Admitting: Physical Therapy

## 2018-05-31 DIAGNOSIS — R252 Cramp and spasm: Secondary | ICD-10-CM | POA: Diagnosis not present

## 2018-05-31 DIAGNOSIS — G8929 Other chronic pain: Secondary | ICD-10-CM | POA: Diagnosis not present

## 2018-05-31 DIAGNOSIS — M5441 Lumbago with sciatica, right side: Secondary | ICD-10-CM

## 2018-05-31 NOTE — Therapy (Signed)
[facility] [place] [address] [place], [place], [address] Phone: [phone]   Fax:  [phone]  Physical Therapy Treatment  Patient Details  Name: SUMIT BRANHAM MRN: [id number] Date of Birth: [birth year] Referring Provider: Dr. Venita Lick   Encounter Date: 05/31/2018  PT End of Session - 05/31/18 1250    Visit Number  2    Number of Visits  12    Date for PT Re-Evaluation  07/09/18    Authorization Type  BCBS    PT Start Time  1200    PT Stop Time  1242    PT Time Calculation (min)  42 min    Activity Tolerance  Patient tolerated treatment well    Behavior During Therapy  [facility] for tasks assessed/performed       Past Medical History:  Diagnosis Date  . Chronic prostatitis 08/04/2014  . Hypertriglyceridemia 08/04/2014  . Hypogonadism in male 08/04/2014  . Hypothyroidism 08/04/2014    Past Surgical History:  Procedure Laterality Date  . APPENDECTOMY    . SHOULDER ARTHROSCOPY    . TENDON REPAIR      There were no vitals filed for this visit.  Subjective Assessment - 05/31/18 1202    Subjective  had episode of tightness after walking for 20 min; glutes and pirifomis seemed to spasm    Patient Stated Goals  improve pain    Pain Score  2     Pain Location  --   into Rt lateral shin                      [facility] Adult PT Treatment/Exercise - 05/31/18 1208      Exercises   Exercises  Lumbar      Lumbar Exercises: Stretches   Active Hamstring Stretch  Right;2 reps;30 seconds    Active Hamstring Stretch Limitations  90/90 stretch    Piriformis Stretch  Right;3 reps;30 seconds      Lumbar Exercises: Aerobic   Nustep  L5 x 6 min      Manual Therapy   Manual Therapy  Soft tissue mobilization    Manual therapy comments  pt prone; skilled palpation and monitoring of soft tissue during DN    Soft tissue mobilization  Rt glutes and piriformis; pt utilized ball roll for self mobilization x 5 min        Trigger Point Dry Needling - 05/31/18 1250    Consent Given?  Yes    Education Handout Provided  Yes    Muscles Treated Lower Body  Gluteus minimus;Gluteus maximus;Piriformis    Gluteus Maximus Response  Twitch response elicited;Palpable increased muscle length    Gluteus Minimus Response  Twitch response elicited;Palpable increased muscle length    Piriformis Response  Twitch response elicited;Palpable increased muscle length                PT Long Term Goals - 05/28/18 1020      PT LONG TERM GOAL #1   Title  independent with HEP    Status  New    Target Date  07/09/18      PT LONG TERM GOAL #2   Title  FOTO score improved to </= 35% limited for improved function    Status  New    Target Date  07/09/18      PT LONG TERM GOAL #3   Title  report ability to amb > 45 min without radicular symptoms for improved function  Status  New    Target Date  07/09/18      PT LONG TERM GOAL #4   Title  demonstrate at least 4/5 hip abduction strength for improved function    Status  New    Target Date  07/09/18      PT LONG TERM GOAL #5   Title  report pain < 4/10 with walking/exercise for improved function    Status  New    Target Date  07/09/18            Plan - 05/31/18 1250    Clinical Impression Statement  Pt tolerated session well today with good twitch responses from DN and good response from manual therapy today reporting improved flexibility in piriformis following.  Pt will continue to benefit from PT to maximize function and decrease pain.    Rehab Potential  Good    PT Frequency  2x / week    PT Duration  6 weeks    PT Treatment/Interventions  ADLs/Self Care Home Management;Cryotherapy;Electrical Stimulation;Iontophoresis 4mg /ml Dexamethasone;Moist Heat;Traction;Therapeutic exercise;Therapeutic activities;Functional mobility training;Stair training;Gait training;Ultrasound;Patient/family education;Manual techniques;Taping;Dry needling;Passive range of  motion    PT Next Visit Plan  review HEP, assess response to DN/manual therapy to Rt glutes/piriformis; modalities PRN; continue per POC    PT Home Exercise Plan  Access Code: [email or web address]    Consulted and Agree with Plan of Care  Patient       Patient will benefit from skilled therapeutic intervention in order to improve the following deficits and impairments:  Abnormal gait, Pain, Increased fascial restricitons, Increased muscle spasms, Decreased strength, Impaired flexibility, Postural dysfunction, Difficulty walking  Visit Diagnosis: Chronic right-sided low back pain with right-sided sciatica  Cramp and spasm     Problem List Patient Active Problem List   Diagnosis Date Noted  . Trochanteric bursitis, right hip 02/26/2018  . Lumbar degenerative disc disease 02/26/2018  . Primary osteoarthritis of right hip 01/24/2018  . Annual physical exam 09/24/2017  . Snoring 09/24/2017  . Palpitations 02/09/2015  . Essential hypertension 01/25/2015  . Insomnia 11/17/2014  . Hypertriglyceridemia 08/04/2014  . Elevated TSH 08/04/2014  . 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 Chronic prostatitis 08/04/2014      Clarita CraneStephanie F Grayden Burley, PT, DPT 05/31/18 12:52 PM     [facility] [address] [place], [place], [address] Phone: [phone]   Fax:  [phone]  Name: Everrett Coombehillip B Pollett MRN: [id number] Date of Birth: 02/11/1966

## 2018-06-04 DIAGNOSIS — M9904 Segmental and somatic dysfunction of sacral region: Secondary | ICD-10-CM | POA: Diagnosis not present

## 2018-06-07 ENCOUNTER — Encounter: Payer: Self-pay | Admitting: Physical Therapy

## 2018-06-07 ENCOUNTER — Ambulatory Visit: Payer: BLUE CROSS/BLUE SHIELD | Admitting: Physical Therapy

## 2018-06-07 DIAGNOSIS — G8929 Other chronic pain: Secondary | ICD-10-CM | POA: Diagnosis not present

## 2018-06-07 DIAGNOSIS — M5441 Lumbago with sciatica, right side: Secondary | ICD-10-CM

## 2018-06-07 DIAGNOSIS — R252 Cramp and spasm: Secondary | ICD-10-CM | POA: Diagnosis not present

## 2018-06-07 NOTE — Patient Instructions (Signed)
Access Code: [email or web address]  URL: [email or web address]  Date: 06/07/2018  Prepared by: Moshe CiproStephanie Eria Lozoya   Exercises  Sidelying Hip Abduction - 10 reps - 3 sets - 1x daily - 7x weekly  Supine Piriformis Stretch with Foot on Ground - 3 reps - 1 sets - 30 sec hold - 2x daily - 7x weekly  Hooklying Single Knee to Chest - 3 reps - 1 sets - 30 sec hold - 2x daily - 7x weekly  Supine Double Knee to Chest - 3 reps - 1 sets - 30 sec hold - 2x daily - 7x weekly  Supine Hamstring Stretch with Strap - 3 reps - 1 sets - 30 sec hold - 2x daily - 7x weekly  Supine ITB Stretch with Strap - 3 reps - 1 sets - 30 sec hold - 2x daily - 7x weekly  Supine Figure 4 Piriformis Stretch - 3 reps - 1 sets - 30 sec hold - 1x daily - 7x weekly  Supine Bridge with Resistance Band - 10 reps - 3 sets - 1x daily - 7x weekly  Patient Education  Trigger Point Dry Needling

## 2018-06-07 NOTE — Therapy (Signed)
[facility] [place] [address] [place], [place], [address] Phone: [phone]   Fax:  [phone]  Physical Therapy Treatment  Patient Details  Name: Casey Elliott MRN: [id number] Date of Birth: 06-10-1966 Referring Provider: Dr. Venita Lick   Encounter Date: 06/07/2018  PT End of Session - 06/07/18 1159    Visit Number  3    Number of Visits  12    Date for PT Re-Evaluation  07/09/18    Authorization Type  BCBS    PT Start Time  1115    PT Stop Time  1157    PT Time Calculation (min)  42 min    Activity Tolerance  Patient tolerated treatment well    Behavior During Therapy  [facility] for tasks assessed/performed       Past Medical History:  Diagnosis Date  . Chronic prostatitis 08/04/2014  . Hypertriglyceridemia 08/04/2014  . Hypogonadism in male 08/04/2014  . Hypothyroidism 08/04/2014    Past Surgical History:  Procedure Laterality Date  . APPENDECTOMY    . SHOULDER ARTHROSCOPY    . TENDON REPAIR      There were no vitals filed for this visit.  Subjective Assessment - 06/07/18 1117    Subjective  had injection on Tuesday and feels about 50% better.  still having cramping and tightness.      Patient Stated Goals  improve pain    Currently in Pain?  Yes    Pain Score  5     Pain Location  Back    Pain Orientation  Right    Pain Descriptors / Indicators  Aching;Burning;Spasm;Sore    Pain Type  Chronic pain;Acute pain    Pain Onset  More than a month ago    Pain Frequency  Constant    Aggravating Factors   sitting, hills/stairs, driving    Pain Relieving Factors  ibuprofen                       [facility] Adult PT Treatment/Exercise - 06/07/18 1119      Exercises   Exercises  Lumbar      Lumbar Exercises: Stretches   Passive Hamstring Stretch  Right;30 seconds;2 reps    ITB Stretch  Right;2 reps;30 seconds    Figure 4 Stretch  2 reps;30 seconds;With overpressure      Lumbar Exercises:  Aerobic   Nustep  L5 x 6 min      Lumbar Exercises: Supine   Bridge  10 reps;5 seconds    Bridge Limitations  with strap for SI stabilization    Other Supine Lumbar Exercises  isometric hip extension 10 x 5 sec; Rt      Manual Therapy   Manual Therapy  Soft tissue mobilization    Manual therapy comments  pt prone; skilled palpation and monitoring of soft tissue during DN    Soft tissue mobilization  Rt glutes, lumbar paraspinals and piriformis; pt utilized ball roll for self mobilization x 5 min       Trigger Point Dry Needling - 06/07/18 1158    Consent Given?  Yes    Muscles Treated Lower Body  Gluteus minimus;Gluteus maximus;Piriformis   and lumbar 4/5 multifidi and paraspinals on Rt   Gluteus Maximus Response  Twitch response elicited;Palpable increased muscle length    Gluteus Minimus Response  Twitch response elicited;Palpable increased muscle length    Piriformis Response  Twitch response elicited;Palpable increased muscle length  PT Long Term Goals - 05/28/18 1020      PT LONG TERM GOAL #1   Title  independent with HEP    Status  New    Target Date  07/09/18      PT LONG TERM GOAL #2   Title  FOTO score improved to </= 35% limited for improved function    Status  New    Target Date  07/09/18      PT LONG TERM GOAL #3   Title  report ability to amb > 45 min without radicular symptoms for improved function    Status  New    Target Date  07/09/18      PT LONG TERM GOAL #4   Title  demonstrate at least 4/5 hip abduction strength for improved function    Status  New    Target Date  07/09/18      PT LONG TERM GOAL #5   Title  report pain < 4/10 with walking/exercise for improved function    Status  New    Target Date  07/09/18            Plan - 06/07/18 1159    Clinical Impression Statement  Pt reported decrease tightness and pain following session today.  Continues to c/o lateral calf tightness on Rt and feel this could be due to  altering gait mechanics from pain.  SI stabilization exercises initiated today with good results.  Will continue to benefit from PT to address deficits.    Rehab Potential  Good    PT Frequency  2x / week    PT Duration  6 weeks    PT Treatment/Interventions  ADLs/Self Care Home Management;Cryotherapy;Electrical Stimulation;Iontophoresis 4mg /ml Dexamethasone;Moist Heat;Traction;Therapeutic exercise;Therapeutic activities;Functional mobility training;Stair training;Gait training;Ultrasound;Patient/family education;Manual techniques;Taping;Dry needling;Passive range of motion    PT Next Visit Plan   assess response to DN/manual therapy to Rt glutes/piriformis; modalities PRN; continue per POC.  SI/hip/core stability    PT Home Exercise Plan  Access Code: [email or web address]    Consulted and Agree with Plan of Care  Patient       Patient will benefit from skilled therapeutic intervention in order to improve the following deficits and impairments:  Abnormal gait, Pain, Increased fascial restricitons, Increased muscle spasms, Decreased strength, Impaired flexibility, Postural dysfunction, Difficulty walking  Visit Diagnosis: Chronic right-sided low back pain with right-sided sciatica  Cramp and spasm     Problem List Patient Active Problem List   Diagnosis Date Noted  . Trochanteric bursitis, right hip 02/26/2018  . Lumbar degenerative disc disease 02/26/2018  . Primary osteoarthritis of right hip 01/24/2018  . Annual physical exam 09/24/2017  . Snoring 09/24/2017  . Palpitations 02/09/2015  . Essential hypertension 01/25/2015  . Insomnia 11/17/2014  . Hypertriglyceridemia 08/04/2014  . Elevated TSH 08/04/2014  . 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 Chronic prostatitis 08/04/2014      Clarita CraneStephanie F Joceline Hinchcliff, PT, DPT 06/07/18 12:01 PM    [facility] [address] [place], [place], [address] Phone: [phone]   Fax:   [phone]  Name: Everrett Coombehillip B Planck MRN: [id number] Date of Birth: 01/16/1966

## 2018-06-12 ENCOUNTER — Encounter: Payer: Self-pay | Admitting: Physical Therapy

## 2018-06-12 ENCOUNTER — Ambulatory Visit: Payer: BLUE CROSS/BLUE SHIELD | Admitting: Physical Therapy

## 2018-06-12 DIAGNOSIS — G8929 Other chronic pain: Secondary | ICD-10-CM | POA: Diagnosis not present

## 2018-06-12 DIAGNOSIS — R252 Cramp and spasm: Secondary | ICD-10-CM | POA: Diagnosis not present

## 2018-06-12 DIAGNOSIS — M5441 Lumbago with sciatica, right side: Secondary | ICD-10-CM

## 2018-06-12 NOTE — Therapy (Signed)
[place], [place], [address] Phone: [phone]   Fax:  [phone]  Physical Therapy Treatment  Patient Details  Name: Casey Elliott MRN: [id number] Date of Birth: [birth year] Referring Provider: Dr. Melina Schools   Encounter Date: 06/12/2018  PT End of Session - 06/12/18 0918    Visit Number  4    Number of Visits  12    Date for PT Re-Evaluation  07/09/18    Authorization Type  BCBS    PT Start Time  0800    PT Stop Time  0842    PT Time Calculation (min)  42 min    Activity Tolerance  Patient tolerated treatment well    Behavior During Therapy  [facility] for tasks assessed/performed       Past Medical History:  Diagnosis Date  . Chronic prostatitis 08/04/2014  . Hypertriglyceridemia 08/04/2014  . Hypogonadism in male 08/04/2014  . Hypothyroidism 08/04/2014    Past Surgical History:  Procedure Laterality Date  . APPENDECTOMY    . SHOULDER ARTHROSCOPY    . TENDON REPAIR      There were no vitals filed for this visit.  Subjective Assessment - 06/12/18 0802    Subjective  feels hes getting better, but slowly.  has most pain when initially sitting in car and lying down at night    Patient Stated Goals  improve pain    Currently in Pain?  Yes    Pain Score  6     Pain Location  Back    Pain Orientation  Right    Pain Descriptors / Indicators  Aching;Burning;Spasm;Sore    Pain Type  Chronic pain;Acute pain    Pain Onset  More than a month ago    Pain Frequency  Constant    Aggravating Factors   sitting, hills/stairs, driving    Pain Relieving Factors  ibuprofen                       [facility] Adult PT Treatment/Exercise - 06/12/18 0806      Exercises   Exercises  Lumbar      Lumbar Exercises: Stretches   Quad Stretch  Right;3 reps;30 seconds   prone with strap and bolster for hip flexor   Other Lumbar Stretch Exercise  adductor stretch supine butterfly and standing  with increase in LE symptoms so stopped stretch    Other Lumbar Stretch Exercise  happy baby hamstring stretch 2 x 30 sec with strap      Lumbar Exercises: Aerobic   Nustep  L5 x 6 min      Lumbar Exercises: Supine   Bridge  10 reps;5 seconds    Bridge Limitations  with strap for SI stabilization    Other Supine Lumbar Exercises  isometric hip extension 10 x 5 sec; Rt / Rt hip ext Lt hip flexion isometric hold 10 x 5 sec    Other Supine Lumbar Exercises  isometric hip abdct with strap 10 x 5 sec      Lumbar Exercises: Sidelying   Other Sidelying Lumbar Exercises  muscle energy isometric external rotation resistance 10 x 5" with significant decrease in pain following exercise; increased time with assisting for how to perform at home                  PT Long Term Goals - 05/28/18 1020      PT LONG TERM GOAL #1   Title  independent with HEP    Status  New    Target Date  07/09/18      PT LONG TERM GOAL #2   Title  FOTO score improved to </= 35% limited for improved function    Status  New    Target Date  07/09/18      PT LONG TERM GOAL #3   Title  report ability to amb > 45 min without radicular symptoms for improved function    Status  New    Target Date  07/09/18      PT LONG TERM GOAL #4   Title  demonstrate at least 4/5 hip abduction strength for improved function    Status  New    Target Date  07/09/18      PT LONG TERM GOAL #5   Title  report pain < 4/10 with walking/exercise for improved function    Status  New    Target Date  07/09/18            Plan - 06/12/18 0919    Clinical Impression Statement  Pt compliant with HEP but pain continues to be present. Pt reports pain is slowly improving and muscle energy exercises today provided immediate relief, but symptoms returned with adductor stretch.  Pt with active trigger points in hamstring and may benefit from DN to address these areas.  Plan to continue with MET and SI stabilization as pt has had  positive response from these.    Rehab Potential  Good    PT Frequency  2x / week    PT Duration  6 weeks    PT Treatment/Interventions  ADLs/Self Care Home Management;Cryotherapy;Electrical Stimulation;Iontophoresis 74m/ml Dexamethasone;Moist Heat;Traction;Therapeutic exercise;Therapeutic activities;Functional mobility training;Stair training;Gait training;Ultrasound;Patient/family education;Manual techniques;Taping;Dry needling;Passive range of motion    PT Next Visit Plan   assess response to DN/manual therapy to Rt glutes/piriformis; modalities PRN; continue per POC.  SI/hip/core stability    PT Home Exercise Plan  Access Code: [email or web address]    Consulted and Agree with Plan of Care  Patient       Patient will benefit from skilled therapeutic intervention in order to improve the following deficits and impairments:  Abnormal gait, Pain, Increased fascial restricitons, Increased muscle spasms, Decreased strength, Impaired flexibility, Postural dysfunction, Difficulty walking  Visit Diagnosis: Chronic right-sided low back pain with right-sided sciatica  Cramp and spasm     Problem List Patient Active Problem List   Diagnosis Date Noted  . Trochanteric bursitis, right hip 02/26/2018  . Lumbar degenerative disc disease 02/26/2018  . Primary osteoarthritis of right hip 01/24/2018  . Annual physical exam 09/24/2017  . Snoring 09/24/2017  . Palpitations 02/09/2015  . Essential hypertension 01/25/2015  . Insomnia 11/17/2014  . Hypertriglyceridemia 08/04/2014  . Elevated TSH 08/04/2014  . 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 Chronic prostatitis 08/04/2014      SLaureen Abrahams PT, DPT 06/12/18 9:21 AM    [facility] [address]Phone: [phone]  Fax:  [phone] Name: Casey MARSALAMRN: [id number] of Birth: [birth year]

## 2018-06-14 ENCOUNTER — Encounter: Payer: Self-pay | Admitting: Physical Therapy

## 2018-06-14 ENCOUNTER — Ambulatory Visit: Payer: BLUE CROSS/BLUE SHIELD | Admitting: Physical Therapy

## 2018-06-14 DIAGNOSIS — G8929 Other chronic pain: Secondary | ICD-10-CM | POA: Diagnosis not present

## 2018-06-14 DIAGNOSIS — R252 Cramp and spasm: Secondary | ICD-10-CM

## 2018-06-14 DIAGNOSIS — M5441 Lumbago with sciatica, right side: Secondary | ICD-10-CM | POA: Diagnosis not present

## 2018-06-14 NOTE — Therapy (Signed)
[place], [place], [address] Phone: [phone]   Fax:  [phone]  Physical Therapy Treatment  Patient Details  Name: Casey Elliott MRN: [id number] Date of Birth: 1966/03/10 Referring Provider (PT): Dr. Melina Schools   Encounter Date: 06/14/2018  PT End of Session - 06/14/18 1156    Visit Number  5    Number of Visits  12    Date for PT Re-Evaluation  07/09/18    Authorization Type  BCBS    PT Start Time  1118    PT Stop Time  1156    PT Time Calculation (min)  38 min    Activity Tolerance  Patient tolerated treatment well    Behavior During Therapy  [facility] for tasks assessed/performed       Past Medical History:  Diagnosis Date  . Chronic prostatitis 08/04/2014  . Hypertriglyceridemia 08/04/2014  . Hypogonadism in male 08/04/2014  . Hypothyroidism 08/04/2014    Past Surgical History:  Procedure Laterality Date  . APPENDECTOMY    . SHOULDER ARTHROSCOPY    . TENDON REPAIR      There were no vitals filed for this visit.  Subjective Assessment - 06/14/18 1119    Subjective  hamstring feels much better today. only having symptoms down the leg at times.    Patient Stated Goals  improve pain    Currently in Pain?  Yes    Pain Score  3     Pain Location  Back    Pain Orientation  Right    Pain Descriptors / Indicators  Aching    Pain Type  Chronic pain;Acute pain    Pain Radiating Towards  into Rt knee    Pain Onset  More than a month ago    Pain Frequency  Constant    Aggravating Factors   sitting, hills/stairs, driving    Pain Relieving Factors  ibuprofen                       [facility] Adult PT Treatment/Exercise - 06/14/18 1123      Exercises   Exercises  Lumbar      Lumbar Exercises: Aerobic   Nustep  L5 x 5 min      Lumbar Exercises: Supine   Bridge  10 reps;5 seconds    Bridge Limitations  with strap for SI stabilization    Other Supine Lumbar Exercises   isometric hip abdct with strap 10 x 5 sec      Lumbar Exercises: Sidelying   Other Sidelying Lumbar Exercises  muscle energy isometric external rotation resistance 10 x 5" with significant decrease in pain following exercise; increased time with assisting for how to perform at home      Lumbar Exercises: Prone   Other Prone Lumbar Exercises  MET:RLE into mat 10 x 5 sec      Manual Therapy   Manual Therapy  Joint mobilization    Joint Mobilization  RLE long axis distraction             PT Education - 06/14/18 1157    Education Details  updated HEP: muscle energy techniques, dynamic stretching    Person(s) Educated  Patient    Methods  Explanation    Comprehension  Verbalized understanding          PT Long Term Goals - 05/28/18 1020      PT LONG TERM GOAL #1   Title  independent  with HEP    Status  New    Target Date  07/09/18      PT LONG TERM GOAL #2   Title  FOTO score improved to </= 35% limited for improved function    Status  New    Target Date  07/09/18      PT LONG TERM GOAL #3   Title  report ability to amb > 45 min without radicular symptoms for improved function    Status  New    Target Date  07/09/18      PT LONG TERM GOAL #4   Title  demonstrate at least 4/5 hip abduction strength for improved function    Status  New    Target Date  07/09/18      PT LONG TERM GOAL #5   Title  report pain < 4/10 with walking/exercise for improved function    Status  New    Target Date  07/09/18            Plan - 06/14/18 1156    Clinical Impression Statement  Pt progressing well towards goal and has nearly met LTG # 5 with improved pain and functional mobility.  Pt responding positively to muscle energy techniques and is pleased with centralization of symptoms.    Rehab Potential  Good    PT Frequency  2x / week    PT Duration  6 weeks    PT Treatment/Interventions  ADLs/Self Care Home Management;Cryotherapy;Electrical Stimulation;Iontophoresis 54m/ml  Dexamethasone;Moist Heat;Traction;Therapeutic exercise;Therapeutic activities;Functional mobility training;Stair training;Gait training;Ultrasound;Patient/family education;Manual techniques;Taping;Dry needling;Passive range of motion    PT Next Visit Plan   assess response to DN/manual therapy to Rt glutes/piriformis; modalities PRN; continue per POC.  SI/hip/core stability    PT Home Exercise Plan  Access Code: [email or web address]    Consulted and Agree with Plan of Care  Patient       Patient will benefit from skilled therapeutic intervention in order to improve the following deficits and impairments:  Abnormal gait, Pain, Increased fascial restricitons, Increased muscle spasms, Decreased strength, Impaired flexibility, Postural dysfunction, Difficulty walking  Visit Diagnosis: Chronic right-sided low back pain with right-sided sciatica  Cramp and spasm     Problem List Patient Active Problem List   Diagnosis Date Noted  . Trochanteric bursitis, right hip 02/26/2018  . Lumbar degenerative disc disease 02/26/2018  . Primary osteoarthritis of right hip 01/24/2018  . Annual physical exam 09/24/2017  . Snoring 09/24/2017  . Palpitations 02/09/2015  . Essential hypertension 01/25/2015  . Insomnia 11/17/2014  . Hypertriglyceridemia 08/04/2014  . Elevated TSH 08/04/2014  . 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 Chronic prostatitis 08/04/2014      SLaureen Abrahams PT, DPT 06/14/18 11:58 AM     [facility] [address]Phone: [phone]  Fax:  [phone] Name: Casey CORSINOMRN: [id number] of Birth: 917-Dec-1967

## 2018-06-18 ENCOUNTER — Ambulatory Visit: Payer: BLUE CROSS/BLUE SHIELD | Admitting: Physical Therapy

## 2018-06-18 ENCOUNTER — Encounter: Payer: Self-pay | Admitting: Physical Therapy

## 2018-06-18 DIAGNOSIS — M5441 Lumbago with sciatica, right side: Secondary | ICD-10-CM

## 2018-06-18 DIAGNOSIS — R252 Cramp and spasm: Secondary | ICD-10-CM

## 2018-06-18 DIAGNOSIS — G8929 Other chronic pain: Secondary | ICD-10-CM | POA: Diagnosis not present

## 2018-06-18 NOTE — Therapy (Signed)
[place], [place], [address] Phone: [phone]   Fax:  [phone]  Physical Therapy Treatment  Patient Details  Name: Casey Elliott MRN: [id number] Date of Birth: [birth year] Referring Provider (PT): Dr. Melina Schools   Encounter Date: 06/18/2018  PT End of Session - 06/18/18 1158    Visit Number  6    Number of Visits  12    Date for PT Re-Evaluation  07/09/18    Authorization Type  BCBS    PT Start Time  1115    PT Stop Time  1157    PT Time Calculation (min)  42 min    Activity Tolerance  Patient tolerated treatment well    Behavior During Therapy  [facility] for tasks assessed/performed       Past Medical History:  Diagnosis Date  . Chronic prostatitis 08/04/2014  . Hypertriglyceridemia 08/04/2014  . Hypogonadism in male 08/04/2014  . Hypothyroidism 08/04/2014    Past Surgical History:  Procedure Laterality Date  . APPENDECTOMY    . SHOULDER ARTHROSCOPY    . TENDON REPAIR      There were no vitals filed for this visit.  Subjective Assessment - 06/18/18 1116    Subjective  having good days/bad days.  overdid it over the weekend and paid for it yesterday.  duration of pain seems to be improving    Patient Stated Goals  improve pain    Currently in Pain?  Yes    Pain Score  5     Pain Location  Back    Pain Orientation  Right    Pain Descriptors / Indicators  Aching    Pain Type  Chronic pain;Acute pain    Pain Radiating Towards  to Rt knee    Pain Onset  More than a month ago    Pain Frequency  Constant    Aggravating Factors   sitting, hills/stairs, driving    Pain Relieving Factors  ibuprofen                       [facility] Adult PT Treatment/Exercise - 06/18/18 1119      Exercises   Exercises  Lumbar      Lumbar Exercises: Aerobic   Nustep  L5 x 5 min      Lumbar Exercises: Supine   Bridge  10 reps;5 seconds    Bridge Limitations  with strap for SI  stabilization    Bridge with [employer] Limitations  hip adduction isometric 10 x 5 sec    Other Supine Lumbar Exercises  hip extension Rt into green physioball x 10 reps    Other Supine Lumbar Exercises  isometric hip abdct with strap 10 x 5 sec      Lumbar Exercises: Sidelying   Clam  Right;10 reps;3 seconds      Manual Therapy   Soft tissue mobilization  Rt gastroc and fibularis       Trigger Point Dry Needling - 06/18/18 1157    Consent Given?  Yes    Muscles Treated Lower Body  Gastrocnemius   and fibularis   Gastrocnemius Response  Twitch response elicited;Palpable increased muscle length                PT Long Term Goals - 05/28/18 1020      PT LONG TERM GOAL #1   Title  independent with HEP    Status  New    Target  Date  07/09/18      PT LONG TERM GOAL #2   Title  FOTO score improved to </= 35% limited for improved function    Status  New    Target Date  07/09/18      PT LONG TERM GOAL #3   Title  report ability to amb > 45 min without radicular symptoms for improved function    Status  New    Target Date  07/09/18      PT LONG TERM GOAL #4   Title  demonstrate at least 4/5 hip abduction strength for improved function    Status  New    Target Date  07/09/18      PT LONG TERM GOAL #5   Title  report pain < 4/10 with walking/exercise for improved function    Status  New    Target Date  07/09/18            Plan - 06/18/18 1307    Clinical Impression Statement  Pt reports aggravation of symptoms over weekend due to increased walking and standing.  No goals met at this time, but positive response to pain in lower leg with DN and manual therapy today.  May need additional DN of Rt glutes/piriformis and will plan to reassess next session.    Rehab Potential  Good    PT Frequency  2x / week    PT Duration  6 weeks    PT Treatment/Interventions  ADLs/Self Care Home Management;Cryotherapy;Electrical Stimulation;Iontophoresis 73m/ml  Dexamethasone;Moist Heat;Traction;Therapeutic exercise;Therapeutic activities;Functional mobility training;Stair training;Gait training;Ultrasound;Patient/family education;Manual techniques;Taping;Dry needling;Passive range of motion    PT Next Visit Plan   assess response to DN/manual therapy to Rt glutes/piriformis; modalities PRN; continue per POC.  SI/hip/core stability    PT Home Exercise Plan  Access Code: [email or web address]    Consulted and Agree with Plan of Care  Patient       Patient will benefit from skilled therapeutic intervention in order to improve the following deficits and impairments:  Abnormal gait, Pain, Increased fascial restricitons, Increased muscle spasms, Decreased strength, Impaired flexibility, Postural dysfunction, Difficulty walking  Visit Diagnosis: Chronic right-sided low back pain with right-sided sciatica  Cramp and spasm     Problem List Patient Active Problem List   Diagnosis Date Noted  . Trochanteric bursitis, right hip 02/26/2018  . Lumbar degenerative disc disease 02/26/2018  . Primary osteoarthritis of right hip 01/24/2018  . Annual physical exam 09/24/2017  . Snoring 09/24/2017  . Palpitations 02/09/2015  . Essential hypertension 01/25/2015  . Insomnia 11/17/2014  . Hypertriglyceridemia 08/04/2014  . Elevated TSH 08/04/2014  . 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 Chronic prostatitis 08/04/2014      SLaureen Abrahams PT, DPT 06/18/18 1:09 PM     [facility] [address]Phone: [phone]  Fax:  [phone] Name: Casey LACIVITAMRN: [id number] of Birth: 9Jan 28, 1967

## 2018-06-21 ENCOUNTER — Ambulatory Visit: Payer: BLUE CROSS/BLUE SHIELD | Admitting: Physical Therapy

## 2018-06-21 DIAGNOSIS — M5441 Lumbago with sciatica, right side: Secondary | ICD-10-CM

## 2018-06-21 DIAGNOSIS — G8929 Other chronic pain: Secondary | ICD-10-CM | POA: Diagnosis not present

## 2018-06-21 DIAGNOSIS — R252 Cramp and spasm: Secondary | ICD-10-CM

## 2018-06-21 NOTE — Therapy (Signed)
[place], [place], [address] Phone: [phone]   Fax:  [phone]  Physical Therapy Treatment  Patient Details  Name: Casey Elliott MRN: [id number] Date of Birth: [birth year] Referring Provider (PT): Dr. Melina Schools   Encounter Date: 06/21/2018  PT End of Session - 06/21/18 0929    Visit Number  7    Number of Visits  12    Date for PT Re-Evaluation  07/09/18    Authorization Type  BCBS    PT Start Time  0803    PT Stop Time  0907    PT Time Calculation (min)  64 min    Activity Tolerance  Patient tolerated treatment well    Behavior During Therapy  [facility] for tasks assessed/performed       Past Medical History:  Diagnosis Date  . Chronic prostatitis 08/04/2014  . Hypertriglyceridemia 08/04/2014  . Hypogonadism in male 08/04/2014  . Hypothyroidism 08/04/2014    Past Surgical History:  Procedure Laterality Date  . APPENDECTOMY    . SHOULDER ARTHROSCOPY    . TENDON REPAIR      There were no vitals filed for this visit.  Subjective Assessment - 06/21/18 0806    Subjective  "yesterday I had my best day. Pain was 2/10."  He still can't do stairs (reciprocal pattern) without pain.  Pt has been doing his exercises daily.      Currently in Pain?  Yes    Pain Score  4     Pain Location  Sacrum   SI joint (localized)   Pain Orientation  Right    Pain Descriptors / Indicators  Aching;Dull    Aggravating Factors   sitting, hills, driving     Pain Relieving Factors  ibuprofen         [facility] PT Assessment - 06/21/18 0001      Assessment   Medical Diagnosis  Rt SI joint dysfunction    Referring Provider (PT)  Dr. Melina Schools      Palpation   SI assessment   Rt ilium higher than Lt; Rt ASIS lower than Lt.  Lt sacral torsion       [facility] Adult PT Treatment/Exercise - 06/21/18 0001      Self-Care   Self-Care  Other Self-Care Comments    Other Self-Care Comments   pt educated on  self massage with ball to Rt QL and glute; pt verbalized understanding.  Pt educated on application and parameters of TENS use on Rt hip/LB; pt verbalized understanding.         Exercises   Exercises  Knee/Hip      Lumbar Exercises: Stretches   Passive Hamstring Stretch  Right;Left;2 reps    Piriformis Stretch  Right;Left;2 reps;20 seconds    Other Lumbar Stretch Exercise  butterfly stretch x 15 sec x 2 reps    Other Lumbar Stretch Exercise  Rt seated hip flexor stretch x 15 sec x 2 reps;  childs pose with Lt lateral flexion x 15 sec       Lumbar Exercises: Supine   Bridge  10 reps;5 seconds      Knee/Hip Exercises: Aerobic   Tread Mill  2.4 mph x 5 min       Modalities   Modalities  Moist Heat;Electrical Stimulation      Moist Heat Therapy   Number Minutes Moist Heat  15 Minutes    Moist Heat Location  Lumbar Spine  Acupuncturist Location  Rt QL, Rt SI and hip    Electrical Stimulation Action  TENS    Electrical Stimulation Parameters  to tolerance    Electrical Stimulation Goals  Pain;Tone      Manual Therapy   Manual Therapy  Muscle Energy Technique;Soft tissue mobilization    Soft tissue mobilization  pelvic rocking with work through [employer]; STM to [employer] QL (brief)    Muscle Energy Technique  MET to correct elevated Rt ilium (in Rt sidelying, then Lt sidelying, contract/relax of QL); MET to correct Rt ant rotation (supine with contract/relax of Rt hamstring) 2 sets; MET to correct Lt sacral torsion in prone                   PT Long Term Goals - 06/21/18 0820      PT LONG TERM GOAL #1   Title  independent with HEP    Status  On-going    Target Date  07/09/18      PT LONG TERM GOAL #2   Title  FOTO score improved to </= 35% limited for improved function    Status  On-going    Target Date  07/09/18      PT LONG TERM GOAL #3   Title  report ability to amb > 45 min without radicular symptoms for improved function     Status  On-going   can walk 20 min prior to symptoms   Target Date  07/09/18      PT LONG TERM GOAL #4   Title  demonstrate at least 4/5 hip abduction strength for improved function    Status  On-going    Target Date  07/09/18      PT LONG TERM GOAL #5   Title  report pain < 4/10 with walking/exercise for improved function    Status  On-going    Target Date  07/09/18            Plan - 06/21/18 0925    Clinical Impression Statement  Pt presents with pelvic asymmetries:  elevated and ant rotated Rt ilium, Lt sacral torsion; improved with MET corrections.  Pt has tight and tender Rt QL and hip flexor.  Pt reported some relief with MET correction and further relief with MHP/estim at end of session.     Rehab Potential  Good    PT Frequency  2x / week    PT Duration  6 weeks    PT Treatment/Interventions  ADLs/Self Care Home Management;Cryotherapy;Electrical Stimulation;Iontophoresis 67m/ml Dexamethasone;Moist Heat;Traction;Therapeutic exercise;Therapeutic activities;Functional mobility training;Stair training;Gait training;Ultrasound;Patient/family education;Manual techniques;Taping;Dry needling;Passive range of motion    PT Next Visit Plan  DN to Rt QL, hip flexor, glute med.  continue bridging exercise and core work.     PT Home Exercise Plan  added childs pose with Lt lateral flexion and Rt seated hip flexor stretch (no paper given)    Consulted and Agree with Plan of Care  Patient       Patient will benefit from skilled therapeutic intervention in order to improve the following deficits and impairments:  Abnormal gait, Pain, Increased fascial restricitons, Increased muscle spasms, Decreased strength, Impaired flexibility, Postural dysfunction, Difficulty walking  Visit Diagnosis: Chronic right-sided low back pain with right-sided sciatica  Cramp and spasm     Problem List Patient Active Problem List   Diagnosis Date Noted  . Trochanteric bursitis, right hip 02/26/2018   . Lumbar degenerative disc disease 02/26/2018  .  Primary osteoarthritis of right hip 01/24/2018  . Annual physical exam 09/24/2017  . Snoring 09/24/2017  . Palpitations 02/09/2015  . Essential hypertension 01/25/2015  . Insomnia 11/17/2014  . Hypertriglyceridemia 08/04/2014  . Elevated TSH 08/04/2014  . 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 Chronic prostatitis 08/04/2014   Kerin Perna, PTA 06/21/18 9:31 AM  [facility] [place], [place], [address] Phone: [phone]   Fax:  [phone]  Name: Casey Elliott MRN: [id number] Date of Birth: [birth year]

## 2018-06-25 ENCOUNTER — Encounter: Payer: Self-pay | Admitting: Physical Therapy

## 2018-06-25 ENCOUNTER — Ambulatory Visit (INDEPENDENT_AMBULATORY_CARE_PROVIDER_SITE_OTHER): Payer: BLUE CROSS/BLUE SHIELD | Admitting: Physical Therapy

## 2018-06-25 DIAGNOSIS — G8929 Other chronic pain: Secondary | ICD-10-CM

## 2018-06-25 DIAGNOSIS — M5441 Lumbago with sciatica, right side: Secondary | ICD-10-CM | POA: Diagnosis not present

## 2018-06-25 DIAGNOSIS — R252 Cramp and spasm: Secondary | ICD-10-CM | POA: Diagnosis not present

## 2018-06-25 NOTE — Therapy (Signed)
[place], [place], [address] Phone: [phone]   Fax:  [phone]  Physical Therapy Treatment  Patient Details  Name: Casey Elliott MRN: [id number] Date of Birth: June 02, 1966 Referring Provider (PT): Dr. Melina Schools   Encounter Date: 06/25/2018  PT End of Session - 06/25/18 1255    Visit Number  8    Number of Visits  12    Date for PT Re-Evaluation  07/09/18    Authorization Type  BCBS    PT Start Time  1157    PT Stop Time  1245    PT Time Calculation (min)  48 min    Activity Tolerance  Patient tolerated treatment well    Behavior During Therapy  [facility] for tasks assessed/performed       Past Medical History:  Diagnosis Date  . Chronic prostatitis 08/04/2014  . Hypertriglyceridemia 08/04/2014  . Hypogonadism in male 08/04/2014  . Hypothyroidism 08/04/2014    Past Surgical History:  Procedure Laterality Date  . APPENDECTOMY    . SHOULDER ARTHROSCOPY    . TENDON REPAIR      There were no vitals filed for this visit.  Subjective Assessment - 06/25/18 1158    Subjective  doing well today, had pain Sunday night and Monday.      Patient Stated Goals  improve pain    Currently in Pain?  Yes    Pain Score  4     Pain Location  Sacrum   SIJ localized   Pain Orientation  Right    Pain Descriptors / Indicators  Aching;Dull    Pain Onset  More than a month ago    Pain Frequency  Constant    Aggravating Factors   sitting, hills, driving    Pain Relieving Factors  ibuprofen         [facility] PT Assessment - 06/25/18 1251      Assessment   Medical Diagnosis  Rt SI joint dysfunction    Referring Provider (PT)  Dr. Melina Schools      Special Tests    Special Tests  Leg LengthTest    Leg length test   True      True   Length  cm    Right  98 in.    Left   97 in.    Comments  added 1 cm heel lift in Lt shoe                   [facility] Adult PT Treatment/Exercise -  06/25/18 1200      Exercises   Exercises  Knee/Hip;Lumbar      Lumbar Exercises: Stretches   Hip Flexor Stretch  Right;2 reps;30 seconds    Other Lumbar Stretch Exercise  childs pose and standing QL stretch 2x30 sec      Lumbar Exercises: Supine   Bridge  10 reps;5 seconds    Bridge Limitations  with strap for SI stabilization      Lumbar Exercises: Sidelying   Other Sidelying Lumbar Exercises  muscle energy isometric external rotation resistance 10 x 5" with significant decrease in pain following exercise      Knee/Hip Exercises: Aerobic   Tread Mill  2.2 x 5 min      Manual Therapy   Manual Therapy  Soft tissue mobilization;Joint mobilization    Joint Mobilization  Rt hip LAD and P/A mobs grades 2-3 with decrease in pain following    Soft tissue  mobilization  Rt QL and glutes                  PT Long Term Goals - 06/21/18 0820      PT LONG TERM GOAL #1   Title  independent with HEP    Status  On-going    Target Date  07/09/18      PT LONG TERM GOAL #2   Title  FOTO score improved to </= 35% limited for improved function    Status  On-going    Target Date  07/09/18      PT LONG TERM GOAL #3   Title  report ability to amb > 45 min without radicular symptoms for improved function    Status  On-going   can walk 20 min prior to symptoms   Target Date  07/09/18      PT LONG TERM GOAL #4   Title  demonstrate at least 4/5 hip abduction strength for improved function    Status  On-going    Target Date  07/09/18      PT LONG TERM GOAL #5   Title  report pain < 4/10 with walking/exercise for improved function    Status  On-going    Target Date  07/09/18            Plan - 06/25/18 1256    Clinical Impression Statement  Pt reports pain is overall improving but still having up and down episodes of pain.  Positive response to DN of QL today as well as additional METs.  Noticed 1 cm LLD with RLE longer, so added heel lift to Lt shoe.  Slowly progressing  towards goals, and pt to follow up with MD next week.    Rehab Potential  Good    PT Frequency  2x / week    PT Duration  6 weeks    PT Treatment/Interventions  ADLs/Self Care Home Management;Cryotherapy;Electrical Stimulation;Iontophoresis 70m/ml Dexamethasone;Moist Heat;Traction;Therapeutic exercise;Therapeutic activities;Functional mobility training;Stair training;Gait training;Ultrasound;Patient/family education;Manual techniques;Taping;Dry needling;Passive range of motion    PT Next Visit Plan  continue bridging/core/MET, DN PRN to glutes/QL/hip flexor, needs FOTO and MD note    PT Home Exercise Plan  added childs pose with Lt lateral flexion and Rt seated hip flexor stretch (no paper given)    Consulted and Agree with Plan of Care  Patient       Patient will benefit from skilled therapeutic intervention in order to improve the following deficits and impairments:  Abnormal gait, Pain, Increased fascial restricitons, Increased muscle spasms, Decreased strength, Impaired flexibility, Postural dysfunction, Difficulty walking  Visit Diagnosis: Chronic right-sided low back pain with right-sided sciatica  Cramp and spasm     Problem List Patient Active Problem List   Diagnosis Date Noted  . Trochanteric bursitis, right hip 02/26/2018  . Lumbar degenerative disc disease 02/26/2018  . Primary osteoarthritis of right hip 01/24/2018  . Annual physical exam 09/24/2017  . Snoring 09/24/2017  . Palpitations 02/09/2015  . Essential hypertension 01/25/2015  . Insomnia 11/17/2014  . Hypertriglyceridemia 08/04/2014  . Elevated TSH 08/04/2014  . 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 Chronic prostatitis 08/04/2014      Casey Elliott PT, DPT 06/25/18 12:59 PM    [facility] [address]Phone: [phone]  Fax:  [phone] Name: Casey FOHLMRN: [id number] of Birth:  [birth year]

## 2018-06-27 ENCOUNTER — Encounter: Payer: Self-pay | Admitting: Physical Therapy

## 2018-06-27 ENCOUNTER — Ambulatory Visit (INDEPENDENT_AMBULATORY_CARE_PROVIDER_SITE_OTHER): Payer: BLUE CROSS/BLUE SHIELD | Admitting: Physical Therapy

## 2018-06-27 DIAGNOSIS — M5441 Lumbago with sciatica, right side: Secondary | ICD-10-CM

## 2018-06-27 DIAGNOSIS — G8929 Other chronic pain: Secondary | ICD-10-CM

## 2018-06-27 DIAGNOSIS — R252 Cramp and spasm: Secondary | ICD-10-CM

## 2018-06-27 NOTE — Therapy (Signed)
[place], [place], [address] Phone: [phone]   Fax:  [phone]  Physical Therapy Treatment  Patient Details  Name: Casey Elliott MRN: [id number] Date of Birth: 01/30/1966 Referring Provider (PT): Dr. Melina Schools   Encounter Date: 06/27/2018  PT End of Session - 06/27/18 1247    Visit Number  9    Number of Visits  12    Date for PT Re-Evaluation  07/09/18    Authorization Type  BCBS    PT Start Time  1203    PT Stop Time  1241    PT Time Calculation (min)  38 min    Activity Tolerance  Patient tolerated treatment well    Behavior During Therapy  [facility] for tasks assessed/performed       Past Medical History:  Diagnosis Date  . Chronic prostatitis 08/04/2014  . Hypertriglyceridemia 08/04/2014  . Hypogonadism in male 08/04/2014  . Hypothyroidism 08/04/2014    Past Surgical History:  Procedure Laterality Date  . APPENDECTOMY    . SHOULDER ARTHROSCOPY    . TENDON REPAIR      There were no vitals filed for this visit.  Subjective Assessment - 06/27/18 1205    Subjective  pain seems to be worse at night when lying down at night    Patient Stated Goals  improve pain    Currently in Pain?  Yes    Pain Score  3     Pain Location  Sacrum   SIJ   Pain Orientation  Right    Pain Descriptors / Indicators  Aching;Dull    Pain Type  Acute pain;Chronic pain    Pain Onset  More than a month ago    Pain Frequency  Constant    Aggravating Factors   sitting, hills, driving    Pain Relieving Factors  ibuprofen                       [facility] Adult PT Treatment/Exercise - 06/27/18 1207      Exercises   Exercises  Knee/Hip;Lumbar      Lumbar Exercises: Stretches   Hip Flexor Stretch  Right;30 seconds;3 reps    Other Lumbar Stretch Exercise  child's pose mid and Rt/Lt stretch 2x30 sec each      Lumbar Exercises: Sidelying   Hip Abduction Limitations  end range pulse 3 x  15 sec with slight hip extension    Other Sidelying Lumbar Exercises  side plank 5x10-15 sec      Lumbar Exercises: Prone   Straight Leg Raise  10 reps;5 seconds   alternating   Opposite Arm/Leg Raise  Right arm/Left leg;Left arm/Right leg;10 reps;5 seconds    Other Prone Lumbar Exercises  MET:RLE into mat 10 x 5 sec      Lumbar Exercises: Quadruped   Plank  5 x 15 sec    Other Quadruped Lumbar Exercises  hip ext with 90 degree knee flexion 2x10 bil; 2.5#      Knee/Hip Exercises: Aerobic   Tread Mill  2.2 x 5 min                  PT Long Term Goals - 06/21/18 0820      PT LONG TERM GOAL #1   Title  independent with HEP    Status  On-going    Target Date  07/09/18      PT LONG TERM GOAL #2  Title  FOTO score improved to </= 35% limited for improved function    Status  On-going    Target Date  07/09/18      PT LONG TERM GOAL #3   Title  report ability to amb > 45 min without radicular symptoms for improved function    Status  On-going   can walk 20 min prior to symptoms   Target Date  07/09/18      PT LONG TERM GOAL #4   Title  demonstrate at least 4/5 hip abduction strength for improved function    Status  On-going    Target Date  07/09/18      PT LONG TERM GOAL #5   Title  report pain < 4/10 with walking/exercise for improved function    Status  On-going    Target Date  07/09/18            Plan - 06/27/18 1247    Clinical Impression Statement  Pt tolerated strengthening exercises well without increase in pain, but fatigues quickly.  Demonstrates poor core stability at this time and will benefit from PT to maximize function.  Plan to check goals and FOTO next visit, as he has MD appt 07/02/18.    Rehab Potential  Good    PT Frequency  2x / week    PT Duration  6 weeks    PT Treatment/Interventions  ADLs/Self Care Home Management;Cryotherapy;Electrical Stimulation;Iontophoresis 43m/ml Dexamethasone;Moist Heat;Traction;Therapeutic  exercise;Therapeutic activities;Functional mobility training;Stair training;Gait training;Ultrasound;Patient/family education;Manual techniques;Taping;Dry needling;Passive range of motion    PT Next Visit Plan  continue bridging/core/MET, DN PRN to glutes/QL/hip flexor, needs FOTO and MD note    PT Home Exercise Plan  added childs pose with Lt lateral flexion and Rt seated hip flexor stretch (no paper given)    Consulted and Agree with Plan of Care  Patient       Patient will benefit from skilled therapeutic intervention in order to improve the following deficits and impairments:  Abnormal gait, Pain, Increased fascial restricitons, Increased muscle spasms, Decreased strength, Impaired flexibility, Postural dysfunction, Difficulty walking  Visit Diagnosis: Chronic right-sided low back pain with right-sided sciatica  Cramp and spasm     Problem List Patient Active Problem List   Diagnosis Date Noted  . Trochanteric bursitis, right hip 02/26/2018  . Lumbar degenerative disc disease 02/26/2018  . Primary osteoarthritis of right hip 01/24/2018  . Annual physical exam 09/24/2017  . Snoring 09/24/2017  . Palpitations 02/09/2015  . Essential hypertension 01/25/2015  . Insomnia 11/17/2014  . Hypertriglyceridemia 08/04/2014  . Elevated TSH 08/04/2014  . 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 Chronic prostatitis 08/04/2014      Casey Elliott PT, DPT 06/27/18 12:51 PM    Casey Elliott [id number]: [phone]  Fax:  [phone] Name: Casey FESPERMANMRN: [id number] of Birth: 911-28-1967

## 2018-07-01 ENCOUNTER — Ambulatory Visit: Payer: BLUE CROSS/BLUE SHIELD | Admitting: Physical Therapy

## 2018-07-01 ENCOUNTER — Encounter: Payer: Self-pay | Admitting: Physical Therapy

## 2018-07-01 ENCOUNTER — Other Ambulatory Visit: Payer: Self-pay | Admitting: Sports Medicine

## 2018-07-01 DIAGNOSIS — G8929 Other chronic pain: Secondary | ICD-10-CM | POA: Diagnosis not present

## 2018-07-01 DIAGNOSIS — M5441 Lumbago with sciatica, right side: Secondary | ICD-10-CM

## 2018-07-01 DIAGNOSIS — R252 Cramp and spasm: Secondary | ICD-10-CM

## 2018-07-01 DIAGNOSIS — E291 Testicular hypofunction: Secondary | ICD-10-CM

## 2018-07-01 NOTE — Therapy (Signed)
[place], [place], [address] Phone: [phone]   Fax:  [phone]  Physical Therapy Treatment  Patient Details  Name: Casey Elliott MRN: [id number] Date of Birth: 1966/06/28 Referring Provider (PT): Dr. Melina Schools   Encounter Date: 07/01/2018  PT End of Session - 07/01/18 1300    Visit Number  10    Number of Visits  12    Date for PT Re-Evaluation  07/09/18    Authorization Type  BCBS    PT Start Time  6553    PT Stop Time  7482    PT Time Calculation (min)  50 min    Activity Tolerance  Patient tolerated treatment well    Behavior During Therapy  [facility] for tasks assessed/performed       Past Medical History:  Diagnosis Date  . Chronic prostatitis 08/04/2014  . Hypertriglyceridemia 08/04/2014  . Hypogonadism in male 08/04/2014  . Hypothyroidism 08/04/2014    Past Surgical History:  Procedure Laterality Date  . APPENDECTOMY    . SHOULDER ARTHROSCOPY    . TENDON REPAIR      There were no vitals filed for this visit.  Subjective Assessment - 07/01/18 1148    Subjective  had a rough weekend; feels he's getting better but concerned we've plateaued.      How long can you walk comfortably?  30 min    Patient Stated Goals  improve pain    Currently in Pain?  Yes    Pain Score  5     Pain Location  Sacrum   SIJ   Pain Orientation  Right    Pain Descriptors / Indicators  Aching;Dull    Pain Type  Acute pain;Chronic pain    Pain Onset  More than a month ago    Pain Frequency  Constant    Aggravating Factors   sitting, hills, driving    Pain Relieving Factors  ibuprofen         [facility] PT Assessment - 07/01/18 1259      Assessment   Medical Diagnosis  Rt SI joint dysfunction    Referring Provider (PT)  Dr. Melina Schools      Observation/Other Assessments   Focus on Therapeutic Outcomes (FOTO)   47 (53% limited)                   [facility] Adult PT  Treatment/Exercise - 07/01/18 1151      Exercises   Exercises  Knee/Hip;Lumbar      Lumbar Exercises: Stretches   Prone on Elbows Stretch  3 reps;60 seconds   continuous; with centralization of symptoms   Other Lumbar Stretch Exercise  child's pose mid and Rt/Lt stretch 2x30 sec each      Lumbar Exercises: Standing   Other Standing Lumbar Exercises  hip ext x 10 bil      Lumbar Exercises: Seated   Other Seated Lumbar Exercises  er/ir Rt hip x 10 with green theraband      Lumbar Exercises: Supine   Bridge  10 reps;5 seconds      Lumbar Exercises: Prone   Other Prone Lumbar Exercises  MET:RLE into mat 10 x 5 sec      Lumbar Exercises: Quadruped   Plank  5 x 15 sec      Knee/Hip Exercises: Aerobic   Tread Mill  2.3 mph x 5 min      Manual Therapy   Joint Mobilization  Rt  hip LAD; A/P mobs                  PT Long Term Goals - 07/01/18 1301      PT LONG TERM GOAL #1   Title  independent with HEP    Status  On-going    Target Date  07/09/18      PT LONG TERM GOAL #2   Title  FOTO score improved to </= 35% limited for improved function    Baseline  10/14: decreased to 53% limited    Status  On-going    Target Date  07/09/18      PT LONG TERM GOAL #3   Title  report ability to amb > 45 min without radicular symptoms for improved function    Baseline  10/14: improved to 30 min    Status  On-going   can walk 20 min prior to symptoms     PT LONG TERM GOAL #4   Title  demonstrate at least 4/5 hip abduction strength for improved function    Status  On-going    Target Date  07/09/18      PT LONG TERM GOAL #5   Title  report pain < 4/10 with walking/exercise for improved function    Baseline  10/14: up to 5/10    Status  On-going    Target Date  07/09/18            Plan - 07/01/18 1302    Clinical Impression Statement  Pt continues to have elevated pain affecting mobility and return to regular exercise and activity.  Responded well to extension  based exercises today with some centralization of symptoms.  Overall pt reports pain has decreased in frequency and intensity but continues to limit his activities.  Pt to see MD tomorrow and will determine next steps.    Rehab Potential  Good    PT Frequency  2x / week    PT Duration  6 weeks    PT Treatment/Interventions  ADLs/Self Care Home Management;Cryotherapy;Electrical Stimulation;Iontophoresis '4mg'$ /ml Dexamethasone;Moist Heat;Traction;Therapeutic exercise;Therapeutic activities;Functional mobility training;Stair training;Gait training;Ultrasound;Patient/family education;Manual techniques;Taping;Dry needling;Passive range of motion    PT Next Visit Plan  continue bridging/core/MET, DN PRN to glutes/QL/hip flexor, see what MD says    PT Home Exercise Plan  added childs pose with Lt lateral flexion and Rt seated hip flexor stretch (no paper given)    Consulted and Agree with Plan of Care  Patient       Patient will benefit from skilled therapeutic intervention in order to improve the following deficits and impairments:  Abnormal gait, Pain, Increased fascial restricitons, Increased muscle spasms, Decreased strength, Impaired flexibility, Postural dysfunction, Difficulty walking  Visit Diagnosis: Chronic right-sided low back pain with right-sided sciatica  Cramp and spasm     Problem List Patient Active Problem List   Diagnosis Date Noted  . Trochanteric bursitis, right hip 02/26/2018  . Lumbar degenerative disc disease 02/26/2018  . Primary osteoarthritis of right hip 01/24/2018  . Annual physical exam 09/24/2017  . Snoring 09/24/2017  . Palpitations 02/09/2015  . Essential hypertension 01/25/2015  . Insomnia 11/17/2014  . Hypertriglyceridemia 08/04/2014  . Elevated TSH 08/04/2014  . 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 Chronic prostatitis 08/04/2014      Laureen Abrahams, PT, DPT 07/01/18 1:26 PM    [facility]  [place], [place], [address] Phone: [phone]   Fax:  [phone]  Name: Casey Elliott  MRN: [id number] Date of Birth: April 04, 1966

## 2018-07-02 DIAGNOSIS — M9904 Segmental and somatic dysfunction of sacral region: Secondary | ICD-10-CM | POA: Diagnosis not present

## 2018-07-03 MED ORDER — CLOMIPHENE CITRATE 50 MG PO TABS: 25.0000 mg | ORAL_TABLET | Freq: Every day | ORAL | 0 refills | Status: DC

## 2018-07-05 ENCOUNTER — Encounter: Payer: Self-pay | Admitting: Physical Therapy

## 2018-07-05 ENCOUNTER — Ambulatory Visit: Payer: BLUE CROSS/BLUE SHIELD | Admitting: Physical Therapy

## 2018-07-05 DIAGNOSIS — R252 Cramp and spasm: Secondary | ICD-10-CM

## 2018-07-05 DIAGNOSIS — G8929 Other chronic pain: Secondary | ICD-10-CM | POA: Diagnosis not present

## 2018-07-05 DIAGNOSIS — M5441 Lumbago with sciatica, right side: Secondary | ICD-10-CM | POA: Diagnosis not present

## 2018-07-05 NOTE — Therapy (Signed)
[place], [place], [address] Phone: [phone]   Fax:  [phone]  Physical Therapy Treatment/Recertification  Patient Details  Name: Casey Elliott MRN: [id number] Date of Birth: 1966/06/18 Referring Provider (PT): Dr. Melina Schools   Encounter Date: 07/05/2018  PT End of Session - 07/05/18 1257    Visit Number  11    Number of Visits  22    Date for PT Re-Evaluation  08/16/18    Authorization Type  BCBS    PT Start Time  1200    PT Stop Time  1240    PT Time Calculation (min)  40 min    Activity Tolerance  Patient tolerated treatment well    Behavior During Therapy  [facility] for tasks assessed/performed       Past Medical History:  Diagnosis Date  . Chronic prostatitis 08/04/2014  . Hypertriglyceridemia 08/04/2014  . Hypogonadism in male 08/04/2014  . Hypothyroidism 08/04/2014    Past Surgical History:  Procedure Laterality Date  . APPENDECTOMY    . SHOULDER ARTHROSCOPY    . TENDON REPAIR      There were no vitals filed for this visit.  Subjective Assessment - 07/05/18 1158    Subjective  saw Dr. Rolena Infante; not a good surgical candidate.  plan for more PT and pain management with Dr. Nelva Bush;  night time seems to be the worst.  feels he has 3-4 good days, then 2-3 bad days.    How long can you walk comfortably?  30 min    Patient Stated Goals  improve pain    Currently in Pain?  Yes    Pain Score  3     Pain Location  Sacrum   SIJ   Pain Orientation  Right    Pain Descriptors / Indicators  Aching;Dull    Pain Type  Acute pain;Chronic pain    Pain Onset  More than a month ago    Pain Frequency  Constant    Aggravating Factors   sitting, hills, driving    Pain Relieving Factors  ibuprofen                       [facility] Adult PT Treatment/Exercise - 07/05/18 1202      Lumbar Exercises: Stretches   Passive Hamstring Stretch  Right;2 reps;30 seconds   straight knee;  bent knee ("happy baby")   ITB Stretch  Right;2 reps;30 seconds    Piriformis Stretch  Right;2 reps;30 seconds    Figure 4 Stretch  2 reps;30 seconds;With overpressure;Supine      Lumbar Exercises: Supine   Bridge with Ball Squeeze Limitations  hip adduction isometric 10 x 5 sec    Other Supine Lumbar Exercises  hip extension alt into green physioball x 10 reps    Other Supine Lumbar Exercises  hamstring curls with bridging on green physioball x 10      Lumbar Exercises: Prone   Other Prone Lumbar Exercises  MET:RLE into mat 10 x 5 sec; ir/er x 10 reps; right with green theraband      Knee/Hip Exercises: Aerobic   Tread Mill  2.3 mph x 5 min      Manual Therapy   Joint Mobilization  Rt hip LAD; A/P mobs                  PT Long Term Goals - 07/05/18 1258      PT LONG TERM GOAL #1  Title  independent with HEP    Status  On-going    Target Date  08/16/18      PT LONG TERM GOAL #2   Title  FOTO score improved to </= 35% limited for improved function    Baseline  10/14: decreased to 53% limited    Status  On-going    Target Date  08/16/18      PT LONG TERM GOAL #3   Title  report ability to amb > 45 min without radicular symptoms for improved function    Baseline  10/14: improved to 30 min    Status  On-going   can walk 20 min prior to symptoms   Target Date  08/16/18      PT LONG TERM GOAL #4   Title  demonstrate at least 4/5 hip abduction strength for improved function    Status  On-going    Target Date  08/16/18      PT LONG TERM GOAL #5   Title  report pain < 4/10 with walking/exercise for improved function    Baseline  10/14: up to 5/10    Status  On-going    Target Date  08/16/18            Plan - 07/05/18 1304    Clinical Impression Statement  Pt reports decreasing frequency of pain but continues to have elevated pain.  Recommend continued PT 2x/wk for up to 6 weeks to continue to address pain and function.  Will continue to benefit from PT  to address deficits.    Rehab Potential  Good    PT Frequency  2x / week    PT Duration  6 weeks    PT Treatment/Interventions  ADLs/Self Care Home Management;Cryotherapy;Electrical Stimulation;Iontophoresis 32m/ml Dexamethasone;Moist Heat;Traction;Therapeutic exercise;Therapeutic activities;Functional mobility training;Stair training;Gait training;Ultrasound;Patient/family education;Manual techniques;Taping;Dry needling;Passive range of motion    PT Next Visit Plan  continue bridging/core/MET, DN PRN to glutes/QL/hip flexor, possible ionto    PT Home Exercise Plan  added childs pose with Lt lateral flexion and Rt seated hip flexor stretch (no paper given)    Consulted and Agree with Plan of Care  Patient       Patient will benefit from skilled therapeutic intervention in order to improve the following deficits and impairments:  Abnormal gait, Pain, Increased fascial restricitons, Increased muscle spasms, Decreased strength, Impaired flexibility, Postural dysfunction, Difficulty walking  Visit Diagnosis: Chronic right-sided low back pain with right-sided sciatica - Plan: PT plan of care cert/re-cert  Cramp and spasm - Plan: PT plan of care cert/re-cert     Problem List Patient Active Problem List   Diagnosis Date Noted  . Trochanteric bursitis, right hip 02/26/2018  . Lumbar degenerative disc disease 02/26/2018  . Primary osteoarthritis of right hip 01/24/2018  . Annual physical exam 09/24/2017  . Snoring 09/24/2017  . Palpitations 02/09/2015  . Essential hypertension 01/25/2015  . Insomnia 11/17/2014  . Hypertriglyceridemia 08/04/2014  . Elevated TSH 08/04/2014  . 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 Chronic prostatitis 08/04/2014      SLaureen Abrahams PT, DPT 07/05/18 1:09 PM     [facility] [address]Phone: [phone]  Fax:  [phone] Name: Casey SEELINGMRN: [id number] of Birth: [birth year]

## 2018-07-09 ENCOUNTER — Ambulatory Visit: Payer: BLUE CROSS/BLUE SHIELD | Admitting: Physical Therapy

## 2018-07-09 DIAGNOSIS — R252 Cramp and spasm: Secondary | ICD-10-CM | POA: Diagnosis not present

## 2018-07-09 DIAGNOSIS — M5441 Lumbago with sciatica, right side: Secondary | ICD-10-CM | POA: Diagnosis not present

## 2018-07-09 DIAGNOSIS — G8929 Other chronic pain: Secondary | ICD-10-CM | POA: Diagnosis not present

## 2018-07-09 NOTE — Therapy (Signed)
[place], [place], [address] Phone: [phone]   Fax:  [phone]  Physical Therapy Treatment  Patient Details  Name: Casey Elliott MRN: [id number] Date of Birth: [birth year] Referring Provider (PT): Dr. Melina Schools   Encounter Date: 07/09/2018  PT End of Session - 07/09/18 1240    Visit Number  12    Number of Visits  22    Date for PT Re-Evaluation  08/16/18    Authorization Type  BCBS    PT Start Time  6962    PT Stop Time  1246    PT Time Calculation (min)  61 min    Activity Tolerance  Patient tolerated treatment well    Behavior During Therapy  [facility] for tasks assessed/performed       Past Medical History:  Diagnosis Date  . Chronic prostatitis 08/04/2014  . Hypertriglyceridemia 08/04/2014  . Hypogonadism in male 08/04/2014  . Hypothyroidism 08/04/2014    Past Surgical History:  Procedure Laterality Date  . APPENDECTOMY    . SHOULDER ARTHROSCOPY    . TENDON REPAIR      There were no vitals filed for this visit.  Subjective Assessment - 07/09/18 1255    Subjective  Pt reports he still cannot go upstairs without pain and difficulty with RLE.  Last week was bad, but he did have to do a lot of driving.  Today is not so bad so far.  He also reports he can stand 40 min prior to increase in pain.     Currently in Pain?  Yes    Pain Score  4     Pain Location  Sacrum    Pain Orientation  Right    Pain Descriptors / Indicators  Aching;Dull    Aggravating Factors   stairs, driviing    Pain Relieving Factors  ibprofen         [facility] PT Assessment - 07/09/18 0001      Assessment   Medical Diagnosis  Rt SI joint dysfunction    Referring Provider (PT)  Dr. Melina Schools      Palpation   SI assessment   Rt ilium higher than Lt; Rt ASIS lower than Lt.  Lt sacral torsion        [facility] Adult PT Treatment/Exercise - 07/09/18 0001      Self-Care   Other Self-Care Comments   pt  educated on self massage with ball to Rt QL and glute; pt verbalized understanding.  Pt educated on application and parameters of TENS use on Rt hip/LB; pt verbalized understanding.         Lumbar Exercises: Stretches   Hip Flexor Stretch  Right;4 reps;Left;2 reps;30 seconds   seated with leg back   Prone on Elbows Stretch  2 reps;10 seconds    Quad Stretch  Right;Left;2 reps;20 seconds   prone with strap     Knee/Hip Exercises: Aerobic   Tread Mill  2.3 mph x 5 min      Moist Heat Therapy   Number Minutes Moist Heat  15 Minutes    Moist Heat Location  Lumbar Spine   glutes     Electrical Stimulation   Electrical Stimulation Location  Rt QL and Rt SI,  ant Rt hip    Electrical Stimulation Action  premod to each area      Manual Therapy   Manual Therapy  Myofascial release    Myofascial Release  MFR to Rt iliacus  and psoas, Rt QL     Muscle Energy Technique  MET to correct Rt ant rotation (supine with contract/relax of Rt hamstring) 2 sets; MET to correct Lt sacral torsion in prone       Discussed current HEP and modification possibilities to decrease irritation at SI joint.     PT Long Term Goals - 07/05/18 1258      PT LONG TERM GOAL #1   Title  independent with HEP    Status  On-going    Target Date  08/16/18      PT LONG TERM GOAL #2   Title  FOTO score improved to </= 35% limited for improved function    Baseline  10/14: decreased to 53% limited    Status  On-going    Target Date  08/16/18      PT LONG TERM GOAL #3   Title  report ability to amb > 45 min without radicular symptoms for improved function    Baseline  10/14: improved to 30 min    Status  On-going   can walk 20 min prior to symptoms   Target Date  08/16/18      PT LONG TERM GOAL #4   Title  demonstrate at least 4/5 hip abduction strength for improved function    Status  On-going    Target Date  08/16/18      PT LONG TERM GOAL #5   Title  report pain < 4/10 with walking/exercise for improved  function    Baseline  10/14: up to 5/10    Status  On-going    Target Date  08/16/18            Plan - 07/09/18 1241    Clinical Impression Statement  Pt's Rt ilium was anteriorly rotated; improved with MET correction.  Tight/ tender in Rt iliopsoas with manual therapy; educated on self release techniques with ball.  Pt reported decrease in symptoms after manual therapy and further reduction after MHP/ estim.   Pt progressing gradually towards goals.     Rehab Potential  Good    PT Frequency  2x / week    PT Duration  6 weeks    PT Treatment/Interventions  ADLs/Self Care Home Management;Cryotherapy;Electrical Stimulation;Iontophoresis 44m/ml Dexamethasone;Moist Heat;Traction;Therapeutic exercise;Therapeutic activities;Functional mobility training;Stair training;Gait training;Ultrasound;Patient/family education;Manual techniques;Taping;Dry needling;Passive range of motion    PT Next Visit Plan  manual therapy/ DN to Rt iliopsoas.  pelvic stabilization.     Consulted and Agree with Plan of Care  Patient       Patient will benefit from skilled therapeutic intervention in order to improve the following deficits and impairments:  Abnormal gait, Pain, Increased fascial restricitons, Increased muscle spasms, Decreased strength, Impaired flexibility, Postural dysfunction, Difficulty walking  Visit Diagnosis: Chronic right-sided low back pain with right-sided sciatica  Cramp and spasm     Problem List Patient Active Problem List   Diagnosis Date Noted  . Trochanteric bursitis, right hip 02/26/2018  . Lumbar degenerative disc disease 02/26/2018  . Primary osteoarthritis of right hip 01/24/2018  . Annual physical exam 09/24/2017  . Snoring 09/24/2017  . Palpitations 02/09/2015  . Essential hypertension 01/25/2015  . Insomnia 11/17/2014  . Hypertriglyceridemia 08/04/2014  . Elevated TSH 08/04/2014  . 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 Chronic prostatitis 08/04/2014    JKerin Perna PTA 07/09/18 1:00 PM  [place] [address]Phone: [phone]  Fax:  [phone] Name:  Casey Elliott MRN: [id number] Date of Birth: 27-Feb-1966

## 2018-07-12 ENCOUNTER — Ambulatory Visit: Payer: BLUE CROSS/BLUE SHIELD | Admitting: Physical Therapy

## 2018-07-12 ENCOUNTER — Encounter: Payer: Self-pay | Admitting: Physical Therapy

## 2018-07-12 DIAGNOSIS — R252 Cramp and spasm: Secondary | ICD-10-CM

## 2018-07-12 DIAGNOSIS — M5441 Lumbago with sciatica, right side: Secondary | ICD-10-CM

## 2018-07-12 DIAGNOSIS — G8929 Other chronic pain: Secondary | ICD-10-CM

## 2018-07-12 NOTE — Therapy (Signed)
[facility] [place] [address] [place], [place], [address] Phone: [phone]   Fax:  [phone]  Physical Therapy Treatment  Patient Details  Name: Casey Elliott MRN: [id number] Date of Birth: March 08, 1966 Referring Provider (PT): Dr. Venita Lick   Encounter Date: 07/12/2018  PT End of Session - 07/12/18 1246    Visit Number  13    Number of Visits  22    Date for PT Re-Evaluation  08/16/18    Authorization Type  BCBS    PT Start Time  1155    PT Stop Time  1238    PT Time Calculation (min)  43 min    Activity Tolerance  Patient tolerated treatment well    Behavior During Therapy  [facility] for tasks assessed/performed       Past Medical History:  Diagnosis Date  . Chronic prostatitis 08/04/2014  . Hypertriglyceridemia 08/04/2014  . Hypogonadism in male 08/04/2014  . Hypothyroidism 08/04/2014    Past Surgical History:  Procedure Laterality Date  . APPENDECTOMY    . SHOULDER ARTHROSCOPY    . TENDON REPAIR      There were no vitals filed for this visit.  Subjective Assessment - 07/12/18 1155    Subjective  slept without pain for the first time since april; dull ache pain versus sharp pain    Patient Stated Goals  improve pain    Currently in Pain?  Yes   c/o discomfort   Pain Score  3     Pain Location  Buttocks    Pain Orientation  Right    Pain Descriptors / Indicators  Aching;Dull    Pain Type  Chronic pain;Acute pain    Pain Onset  More than a month ago    Pain Frequency  Constant    Aggravating Factors   stairs, driving    Pain Relieving Factors  ibuprofen                       [facility] Adult PT Treatment/Exercise - 07/12/18 1158      Lumbar Exercises: Stretches   Hip Flexor Stretch  Right;4 reps;Left;2 reps;30 seconds   seated with leg back   Quad Stretch  Right;Left;2 reps;30 seconds   prone with strap and 1/2 foam roll   Other Lumbar Stretch Exercise  child's pose mid and Rt/Lt  stretch 2x30 sec each      Knee/Hip Exercises: Aerobic   Tread Mill  2.4 mph x 5 min      Manual Therapy   Myofascial Release  MFR to Rt iliacus and psoas; TFL with and without instrument                  PT Long Term Goals - 07/05/18 1258      PT LONG TERM GOAL #1   Title  independent with HEP    Status  On-going    Target Date  08/16/18      PT LONG TERM GOAL #2   Title  FOTO score improved to </= 35% limited for improved function    Baseline  10/14: decreased to 53% limited    Status  On-going    Target Date  08/16/18      PT LONG TERM GOAL #3   Title  report ability to amb > 45 min without radicular symptoms for improved function    Baseline  10/14: improved to 30 min    Status  On-going  can walk 20 min prior to symptoms   Target Date  08/16/18      PT LONG TERM GOAL #4   Title  demonstrate at least 4/5 hip abduction strength for improved function    Status  On-going    Target Date  08/16/18      PT LONG TERM GOAL #5   Title  report pain < 4/10 with walking/exercise for improved function    Baseline  10/14: up to 5/10    Status  On-going    Target Date  08/16/18            Plan - 07/12/18 1246    Clinical Impression Statement  Pt tolerated session well today without increase in pain following session.  Progressing well with PT, and feel he may be ready for hold after next week if increased function and decreased pain continues.  Will continue to benefit from PT to maximize function.    Rehab Potential  Good    PT Frequency  2x / week    PT Duration  6 weeks    PT Treatment/Interventions  ADLs/Self Care Home Management;Cryotherapy;Electrical Stimulation;Iontophoresis 4mg /ml Dexamethasone;Moist Heat;Traction;Therapeutic exercise;Therapeutic activities;Functional mobility training;Stair training;Gait training;Ultrasound;Patient/family education;Manual techniques;Taping;Dry needling;Passive range of motion    PT Next Visit Plan  manual therapy/ DN to  Rt iliopsoas.  pelvic stabilization.     Consulted and Agree with Plan of Care  Patient       Patient will benefit from skilled therapeutic intervention in order to improve the following deficits and impairments:  Abnormal gait, Pain, Increased fascial restricitons, Increased muscle spasms, Decreased strength, Impaired flexibility, Postural dysfunction, Difficulty walking  Visit Diagnosis: Chronic right-sided low back pain with right-sided sciatica  Cramp and spasm     Problem List Patient Active Problem List   Diagnosis Date Noted  . Trochanteric bursitis, right hip 02/26/2018  . Lumbar degenerative disc disease 02/26/2018  . Primary osteoarthritis of right hip 01/24/2018  . Annual physical exam 09/24/2017  . Snoring 09/24/2017  . Palpitations 02/09/2015  . Essential hypertension 01/25/2015  . Insomnia 11/17/2014  . Hypertriglyceridemia 08/04/2014  . Elevated TSH 08/04/2014  . 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 Chronic prostatitis 08/04/2014      Clarita Crane, PT, DPT 07/12/18 12:54 PM      [facility] [address] [place], [place], [address] Phone: [phone]   Fax:  [phone]  Name: AZAN MANERI MRN: [id number] Date of Birth: 06/12/1966

## 2018-07-15 ENCOUNTER — Ambulatory Visit: Payer: BLUE CROSS/BLUE SHIELD | Admitting: Physical Therapy

## 2018-07-15 ENCOUNTER — Encounter: Payer: Self-pay | Admitting: Physical Therapy

## 2018-07-15 DIAGNOSIS — M5441 Lumbago with sciatica, right side: Secondary | ICD-10-CM | POA: Diagnosis not present

## 2018-07-15 DIAGNOSIS — G8929 Other chronic pain: Secondary | ICD-10-CM | POA: Diagnosis not present

## 2018-07-15 DIAGNOSIS — R252 Cramp and spasm: Secondary | ICD-10-CM

## 2018-07-15 NOTE — Therapy (Signed)
[facility] [place] [address] [place], [place], [address] Phone: [phone]   Fax:  [phone]  Physical Therapy Treatment  Patient Details  Name: Casey Elliott MRN: [id number] Date of Birth: 07/13/1966 Referring Provider (PT): Dr. Venita Lick   Encounter Date: 07/15/2018  PT End of Session - 07/15/18 1157    Visit Number  14    Number of Visits  22    Date for PT Re-Evaluation  08/16/18    Authorization Type  BCBS    PT Start Time  1150    PT Stop Time  1245   MHP last 12 min    PT Time Calculation (min)  55 min    Activity Tolerance  Patient tolerated treatment well    Behavior During Therapy  [facility] for tasks assessed/performed       Past Medical History:  Diagnosis Date  . Chronic prostatitis 08/04/2014  . Hypertriglyceridemia 08/04/2014  . Hypogonadism in male 08/04/2014  . Hypothyroidism 08/04/2014    Past Surgical History:  Procedure Laterality Date  . APPENDECTOMY    . SHOULDER ARTHROSCOPY    . TENDON REPAIR      There were no vitals filed for this visit.  Subjective Assessment - 07/15/18 1157    Subjective  Pt reports he went to a beer bash over weekend, with a lot of walking and standing.  He tripped and fell up some stairs.  "I'm paying for it all now".        Currently in Pain?  Yes    Pain Score  4     Pain Location  Sacrum    Pain Orientation  Right    Aggravating Factors   stairs, driving     Pain Relieving Factors  stretches, ibprofen         [facility] PT Assessment - 07/15/18 0001      Assessment   Medical Diagnosis  Rt SI joint dysfunction    Referring Provider (PT)  Dr. Venita Lick        [facility] Adult PT Treatment/Exercise - 07/15/18 0001      Exercises   Exercises  Lumbar      Lumbar Exercises: Stretches   Passive Hamstring Stretch  Right;4 reps   supine with strap, opp knee bent   Passive Hamstring Stretch Limitations  2 with knee straight; 2 with knee slightly bent.      Lower Trunk Rotation  5 reps;10 seconds   rotation to L with RLE crossed over LLE.    Hip Flexor Stretch  Left;2 reps;Right;4 reps;30 seconds   3,4th rep with arm overhead   Hip Flexor Stretch Limitations  seated, leg back, knee bend and straight    Piriformis Stretch  Right;Left;2 reps;30 seconds    Other Lumbar Stretch Exercise  child's pose mid and Lt stretch 2x30 sec each      Lumbar Exercises: Machines for Strengthening   Tread Mill  2.0-2.3 mph x 3.5 min       Lumbar Exercises: Supine   Heel Slides  5 reps   TA contraction; challenging.     Moist Heat Therapy   Number Minutes Moist Heat  12 Minutes    Moist Heat Location  Lumbar Spine   and hip flexor      Electrical Stimulation   Electrical Stimulation Location  --   pt declined     Manual Therapy   Soft tissue mobilization  STM to Rt iliopsoas and Rt QL, lateral  quad, glute med.     Myofascial Release  MFR to Rt iliopsoas and QL in supine and prone.          PT Long Term Goals - 07/05/18 1258      PT LONG TERM GOAL #1   Title  independent with HEP    Status  On-going    Target Date  08/16/18      PT LONG TERM GOAL #2   Title  FOTO score improved to </= 35% limited for improved function    Baseline  10/14: decreased to 53% limited    Status  On-going    Target Date  08/16/18      PT LONG TERM GOAL #3   Title  report ability to amb > 45 min without radicular symptoms for improved function    Baseline  10/14: improved to 30 min    Status  On-going   can walk 20 min prior to symptoms   Target Date  08/16/18      PT LONG TERM GOAL #4   Title  demonstrate at least 4/5 hip abduction strength for improved function    Status  On-going    Target Date  08/16/18      PT LONG TERM GOAL #5   Title  report pain < 4/10 with walking/exercise for improved function    Baseline  10/14: up to 5/10    Status  On-going    Target Date  08/16/18            Plan - 07/15/18 1250    Clinical Impression Statement   Pt had flare up of symptoms since attending a party with prolonged walking/ standing as well as a fall on the stairs.  His Rt QL and iliopsoas remain tight and tender. Pt reported some release of tightness and pain at end of session.   Pt will continue to benefit from PT to maximize functional mobility.     Rehab Potential  Good    PT Frequency  2x / week    PT Duration  8 weeks    PT Treatment/Interventions  ADLs/Self Care Home Management;Cryotherapy;Electrical Stimulation;Iontophoresis 4mg /ml Dexamethasone;Moist Heat;Traction;Therapeutic exercise;Therapeutic activities;Functional mobility training;Stair training;Gait training;Ultrasound;Patient/family education;Manual techniques;Taping;Dry needling;Passive range of motion    PT Next Visit Plan  manual therapy/ DN to Rt iliopsoas.  pelvic stabilization.     Consulted and Agree with Plan of Care  Patient       Patient will benefit from skilled therapeutic intervention in order to improve the following deficits and impairments:  Abnormal gait, Pain, Increased fascial restricitons, Increased muscle spasms, Decreased strength, Impaired flexibility, Postural dysfunction, Difficulty walking  Visit Diagnosis: Chronic right-sided low back pain with right-sided sciatica  Cramp and spasm     Problem List Patient Active Problem List   Diagnosis Date Noted  . Trochanteric bursitis, right hip 02/26/2018  . Lumbar degenerative disc disease 02/26/2018  . Primary osteoarthritis of right hip 01/24/2018  . Annual physical exam 09/24/2017  . Snoring 09/24/2017  . Palpitations 02/09/2015  . Essential hypertension 01/25/2015  . Insomnia 11/17/2014  . Hypertriglyceridemia 08/04/2014  . Elevated TSH 08/04/2014  . 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 Chronic prostatitis 08/04/2014   Mayer Camel, PTA 07/15/18 1:00 PM  [facility] [place] [address] [place] [address] [place], [place], [address] Phone:  [phone]   Fax:  [phone]  Name: Casey Elliott MRN: [id number] Date of Birth: 1966-09-07

## 2018-07-19 ENCOUNTER — Ambulatory Visit: Payer: BLUE CROSS/BLUE SHIELD | Admitting: Physical Therapy

## 2018-07-19 DIAGNOSIS — G8929 Other chronic pain: Secondary | ICD-10-CM

## 2018-07-19 DIAGNOSIS — R252 Cramp and spasm: Secondary | ICD-10-CM | POA: Diagnosis not present

## 2018-07-19 DIAGNOSIS — M5441 Lumbago with sciatica, right side: Secondary | ICD-10-CM | POA: Diagnosis not present

## 2018-07-19 NOTE — Therapy (Signed)
[facility] [place] [address] [place], [place], [address] Phone: [phone]   Fax:  [phone]  Physical Therapy Treatment  Patient Details  Name: Casey Elliott MRN: [id number] Date of Birth: [birth year] Referring Provider (PT): Dr. Venita Lick   Encounter Date: 07/19/2018  PT End of Session - 07/19/18 1150    Visit Number  15    Number of Visits  22    Date for PT Re-Evaluation  08/16/18    Authorization Type  BCBS    PT Start Time  1148    PT Stop Time  1245    PT Time Calculation (min)  57 min    Activity Tolerance  Patient tolerated treatment well    Behavior During Therapy  [facility] for tasks assessed/performed       Past Medical History:  Diagnosis Date  . Chronic prostatitis 08/04/2014  . Hypertriglyceridemia 08/04/2014  . Hypogonadism in male 08/04/2014  . Hypothyroidism 08/04/2014    Past Surgical History:  Procedure Laterality Date  . APPENDECTOMY    . SHOULDER ARTHROSCOPY    . TENDON REPAIR      There were no vitals filed for this visit.  Subjective Assessment - 07/19/18 1150    Subjective  Pt reports his pain in hip was 10/10 on wed PM and all day Thurs.  Things are slowly improving.      Patient Stated Goals  improve pain    Currently in Pain?  Yes    Pain Score  4     Pain Orientation  Right    Pain Descriptors / Indicators  Aching    Aggravating Factors   stairs driving    Pain Relieving Factors  stretches, ibprofen, prone position, heat         [facility] PT Assessment - 07/19/18 0001      Assessment   Medical Diagnosis  Rt SI joint dysfunction    Referring Provider (PT)  Dr. Venita Lick        [facility] Adult PT Treatment/Exercise - 07/19/18 0001      Lumbar Exercises: Stretches   Passive Hamstring Stretch  Right;2 reps;30 seconds   supine with strap, opp knee bent   Passive Hamstring Stretch Limitations  1 with knee bent, 1 with knee straight    Single Knee to Chest Stretch  Right;2  reps;10 seconds    Hip Flexor Stretch  Left;2 reps;Right;4 reps;30 seconds   3,4th rep with arm overhead   Prone on Elbows Stretch  10 seconds;5 reps   relief reported in this position   Piriformis Stretch  Right;Left;2 reps;30 seconds   seated trial   Other Lumbar Stretch Exercise  childs pose x 20 sec      Lumbar Exercises: Supine   Bridge  5 reps;5 seconds   isometric with minimal lift of pelvis.      Lumbar Exercises: Prone   Opposite Arm/Leg Raise  Right arm/Left leg;Left arm/Right leg;5 reps;Limitations   2 sets, limiting height of lift    Opposite Arm/Leg Raise Limitations  tolerated well.       Knee/Hip Exercises: Aerobic   Tread Mill  2.1 mph x 5 min      Moist Heat Therapy   Number Minutes Moist Heat  15 Minutes    Moist Heat Location  Lumbar Spine;Hip      Electrical Stimulation   Electrical Stimulation Location  Rt QL and Rt piriformis     Electrical Stimulation Action  premod to each  area    Electrical Stimulation Parameters  to tolerance     Electrical Stimulation Goals  Pain;Tone      Manual Therapy   Soft tissue mobilization  STM to Rt QL and glute/ deep hip rotators with pt in prone                  PT Long Term Goals - 07/05/18 1258      PT LONG TERM GOAL #1   Title  independent with HEP    Status  On-going    Target Date  08/16/18      PT LONG TERM GOAL #2   Title  FOTO score improved to </= 35% limited for improved function    Baseline  10/14: decreased to 53% limited    Status  On-going    Target Date  08/16/18      PT LONG TERM GOAL #3   Title  report ability to amb > 45 min without radicular symptoms for improved function    Baseline  10/14: improved to 30 min    Status  On-going   can walk 20 min prior to symptoms   Target Date  08/16/18      PT LONG TERM GOAL #4   Title  demonstrate at least 4/5 hip abduction strength for improved function    Status  On-going    Target Date  08/16/18      PT LONG TERM GOAL #5   Title   report pain < 4/10 with walking/exercise for improved function    Baseline  10/14: up to 5/10    Status  On-going    Target Date  08/16/18            Plan - 07/19/18 1239    Clinical Impression Statement  Continued flare up from event last weekend.  Pt continues to have difficulty tolerating sitting for prolonged periods.  Pt was unable to tolerate full bridge, but could tolerate bridge isometric with hips not clearing table.  Pt reported relief of symptoms with POE stretch and opp arm/leg lift.  He reported further relief with use of estim / MHP to hip rotators and Rt low back.  Pt making gradual gains towards goals.      Rehab Potential  Good    PT Frequency  2x / week    PT Duration  8 weeks    PT Treatment/Interventions  ADLs/Self Care Home Management;Cryotherapy;Electrical Stimulation;Iontophoresis 4mg /ml Dexamethasone;Moist Heat;Traction;Therapeutic exercise;Therapeutic activities;Functional mobility training;Stair training;Gait training;Ultrasound;Patient/family education;Manual techniques;Taping;Dry needling;Passive range of motion    PT Next Visit Plan  manual therapy Rt iliopsoas, QL, hip rotators.  pelvic stabilization exercises.     Consulted and Agree with Plan of Care  Patient       Patient will benefit from skilled therapeutic intervention in order to improve the following deficits and impairments:  Abnormal gait, Pain, Increased fascial restricitons, Increased muscle spasms, Decreased strength, Impaired flexibility, Postural dysfunction, Difficulty walking  Visit Diagnosis: Chronic right-sided low back pain with right-sided sciatica  Cramp and spasm     Problem List Patient Active Problem List   Diagnosis Date Noted  . Trochanteric bursitis, right hip 02/26/2018  . Lumbar degenerative disc disease 02/26/2018  . Primary osteoarthritis of right hip 01/24/2018  . Annual physical exam 09/24/2017  . Snoring 09/24/2017  . Palpitations 02/09/2015  . Essential  hypertension 01/25/2015  . Insomnia 11/17/2014  . Hypertriglyceridemia 08/04/2014  . Elevated TSH 08/04/2014  . 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  Chronic prostatitis 08/04/2014   Mayer Camel, PTA 07/19/18 12:54 PM  [facility] [place] [address] [place], [place], [address] Phone: [phone]   Fax:  [phone]  Name: Casey Elliott MRN: [id number] Date of Birth: 1965-11-18

## 2018-07-19 NOTE — Patient Instructions (Signed)
On Elbows (Prone)    Rise up on elbows as high as possible, keeping hips on floor. Hold _10___ seconds. Repeat __5__ times per set. Do __1__ sets per session. Do _2___ sessions per day.  Arm / Leg Lift: Opposite (Prone)    Lift right leg and opposite arm _2___ inches from floor, keeping knee locked. Repeat __5-10_ times per set. Do __1__ sets per session. Do _1___ sessions per day.

## 2018-07-22 ENCOUNTER — Encounter: Payer: BLUE CROSS/BLUE SHIELD | Admitting: Physical Therapy

## 2018-07-24 ENCOUNTER — Ambulatory Visit: Payer: BLUE CROSS/BLUE SHIELD | Admitting: Physical Therapy

## 2018-07-24 DIAGNOSIS — R252 Cramp and spasm: Secondary | ICD-10-CM | POA: Diagnosis not present

## 2018-07-24 DIAGNOSIS — G8929 Other chronic pain: Secondary | ICD-10-CM | POA: Diagnosis not present

## 2018-07-24 DIAGNOSIS — M5441 Lumbago with sciatica, right side: Secondary | ICD-10-CM | POA: Diagnosis not present

## 2018-07-24 NOTE — Therapy (Addendum)
[place], [place], [address] Phone: [phone]   Fax:  [phone]  Physical Therapy Treatment/Discharge   Patient Details  Name: Casey Elliott MRN: [id number] Date of Birth: [birth year] Referring Provider (PT): Dr. Melina Schools   Encounter Date: 07/24/2018  PT End of Session - 07/24/18 0728    Visit Number  16    Number of Visits  22    Date for PT Re-Evaluation  08/16/18    Authorization Type  BCBS    PT Start Time  0718    PT Stop Time  0819    PT Time Calculation (min)  61 min    Activity Tolerance  Patient tolerated treatment well    Behavior During Therapy  [facility] for tasks assessed/performed       Past Medical History:  Diagnosis Date  . Chronic prostatitis 08/04/2014  . Hypertriglyceridemia 08/04/2014  . Hypogonadism in male 08/04/2014  . Hypothyroidism 08/04/2014    Past Surgical History:  Procedure Laterality Date  . APPENDECTOMY    . SHOULDER ARTHROSCOPY    . TENDON REPAIR      There were no vitals filed for this visit.  Subjective Assessment - 07/24/18 0720    Subjective  "This will have to be my last day. I'm running out of money."  Pt reports he had a massage over weekend and this helped loosen things up.  His pain when driving has reduced, his sleep has improved, and his overall pain has reduced.      Currently in Pain?  Yes    Pain Score  3     Pain Location  Buttocks    Pain Orientation  Right    Pain Descriptors / Indicators  Aching    Aggravating Factors   driving, stairs    Pain Relieving Factors  stretches, ibprofen, prone position         [facility] PT Assessment - 07/24/18 0001      Assessment   Medical Diagnosis  Rt SI joint dysfunction    Referring Provider (PT)  Dr. Melina Schools       [facility] Adult PT Treatment/Exercise - 07/24/18 0001      Lumbar Exercises: Stretches   Passive Hamstring Stretch  Right;Left;3 reps;30 seconds   supine with strap,  opp knee bent   Hip Flexor Stretch  Left;2 reps;Right;4 reps;30 seconds   3,4th rep with arm overhead   Hip Flexor Stretch Limitations  seated    Prone on Elbows Stretch  5 reps;10 seconds    Piriformis Stretch  Right;Left;2 reps;30 seconds   seated    Other Lumbar Stretch Exercise  modified Triangle pose with hand on chair     Other Lumbar Stretch Exercise  calf stretch on incline board x 30 sec x 3 reps       Lumbar Exercises: Supine   Bridge  5 reps;5 seconds   limited clearance of pelvis     Lumbar Exercises: Prone   Opposite Arm/Leg Raise  Right arm/Left leg;Left arm/Right leg   8 reps; cues for breathing     Lumbar Exercises: Quadruped   Other Quadruped Lumbar Exercises  bird dog, 1 rep each side (challenging) - increased discomfort with WB into RLE      Knee/Hip Exercises: Aerobic   Tread Mill  2.8 mph x 5.5 min       Moist Heat Therapy   Number Minutes Moist Heat  15 Minutes    Moist Heat  Location  Lumbar Spine;Hip      Electrical Stimulation   Electrical Stimulation Location  Rt QL and Rt piriformis     Electrical Stimulation Action  IFC    Electrical Stimulation Parameters  to tolerance    Electrical Stimulation Goals  Pain;Tone             PT Education - 07/24/18 0816    Education Details  HEP -access code [email or web address], reviewed previous HEP exercises verbally as well as activities to avoid.     Person(s) Educated  Patient    Methods  Explanation;Demonstration;Verbal cues    Comprehension  Verbalized understanding;Returned demonstration;Verbal cues required          PT Long Term Goals - 07/24/18 0731      PT LONG TERM GOAL #1   Title  independent with HEP    Status  Achieved      PT LONG TERM GOAL #2   Title  FOTO score improved to </= 35% limited for improved function    Status  On-going      PT LONG TERM GOAL #3   Title  report ability to amb > 45 min without radicular symptoms for improved function    Status  On-going   can walk 20-30 min  prior to symptoms.      PT LONG TERM GOAL #4   Title  demonstrate at least 4/5 hip abduction strength for improved function    Status  On-going      PT LONG TERM GOAL #5   Title  report pain < 4/10 with walking/exercise for improved function    Status  Achieved            Plan - 07/24/18 0817    Clinical Impression Statement  Pt tolerated all exercises well, reporting reduction of pain by 2 points.  Pt reported further reduction with use of estim/MHP at end of session. He has met LTG#1 and 5 and is making gradual progress towards remaining goals.  Pt verbalized desire to hold therapy while he works on [employer].      Rehab Potential  Good    PT Frequency  2x / week    PT Duration  8 weeks    PT Treatment/Interventions  ADLs/Self Care Home Management;Cryotherapy;Electrical Stimulation;Iontophoresis 78m/ml Dexamethasone;Moist Heat;Traction;Therapeutic exercise;Therapeutic activities;Functional mobility training;Stair training;Gait training;Ultrasound;Patient/family education;Manual techniques;Taping;Dry needling;Passive range of motion    PT Next Visit Plan  spoke to supervising PT; will hold therapy until end of POC.    Consulted and Agree with Plan of Care  Patient       Patient will benefit from skilled therapeutic intervention in order to improve the following deficits and impairments:  Abnormal gait, Pain, Increased fascial restricitons, Increased muscle spasms, Decreased strength, Impaired flexibility, Postural dysfunction, Difficulty walking  Visit Diagnosis: Chronic right-sided low back pain with right-sided sciatica  Cramp and spasm     Problem List Patient Active Problem List   Diagnosis Date Noted  . Trochanteric bursitis, right hip 02/26/2018  . Lumbar degenerative disc disease 02/26/2018  . Primary osteoarthritis of right hip 01/24/2018  . Annual physical exam 09/24/2017  . Snoring 09/24/2017  . Palpitations 02/09/2015  . Essential hypertension 01/25/2015  .  Insomnia 11/17/2014  . Hypertriglyceridemia 08/04/2014  . Elevated TSH 08/04/2014  . 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 Chronic prostatitis 08/04/2014   JKerin Perna PTA 07/24/18 8:45 AM   [address] [place] [address]Phone: [phone]  Fax:  [phone]  Name: Casey Elliott MRN: [id number] Date of Birth: [birth year]    PHYSICAL THERAPY DISCHARGE SUMMARY  Visits from Start of Care: 16  Current functional level related to goals / functional outcomes: See above   Remaining deficits: See above   Education / Equipment: HEP  Plan: Patient agrees to discharge.  Patient goals were not met. Patient is being discharged due to being pleased with the current functional level.  ?????     Laureen Abrahams, PT, DPT 08/20/18 1:24 PM  [facility] [place] [place], [place] [address]  [phone] (office) [phone] (fax)

## 2018-07-25 DIAGNOSIS — M5431 Sciatica, right side: Secondary | ICD-10-CM | POA: Diagnosis not present

## 2018-07-26 ENCOUNTER — Encounter: Payer: BLUE CROSS/BLUE SHIELD | Admitting: Physical Therapy

## 2018-08-06 DIAGNOSIS — M5431 Sciatica, right side: Secondary | ICD-10-CM | POA: Diagnosis not present

## 2018-08-06 DIAGNOSIS — M25561 Pain in right knee: Secondary | ICD-10-CM | POA: Diagnosis not present

## 2018-08-17 DIAGNOSIS — M25561 Pain in right knee: Secondary | ICD-10-CM | POA: Diagnosis not present

## 2018-08-23 ENCOUNTER — Ambulatory Visit: Payer: BLUE CROSS/BLUE SHIELD | Admitting: Sports Medicine

## 2018-08-23 ENCOUNTER — Encounter: Payer: Self-pay | Admitting: Sports Medicine

## 2018-08-23 DIAGNOSIS — Z23 Encounter for immunization: Secondary | ICD-10-CM

## 2018-08-23 DIAGNOSIS — I10 Essential (primary) hypertension: Secondary | ICD-10-CM | POA: Diagnosis not present

## 2018-08-23 DIAGNOSIS — Z0000 Encounter for general adult medical examination without abnormal findings: Secondary | ICD-10-CM

## 2018-08-23 DIAGNOSIS — E291 Testicular hypofunction: Secondary | ICD-10-CM

## 2018-08-23 MED ORDER — CLOMIPHENE CITRATE 50 MG PO TABS: 25.0000 mg | ORAL_TABLET | Freq: Every day | ORAL | 3 refills | Status: DC

## 2018-08-23 NOTE — Progress Notes (Signed)
Subjective:    CC: Follow-up  HPI: Secondary hypogonadism: Needs testosterone levels checked before refilling Clomid.  Has been doing well historically.  Elevated blood pressure: No headaches, visual changes, chest pain.  Would like to work on conservative measures first.  I reviewed the past medical history, family history, social history, surgical history, and allergies today and no changes were needed.  Please see the problem list section below in epic for further details.  Past Medical History: Past Medical History:  Diagnosis Date  . Chronic prostatitis 08/04/2014  . Hypertriglyceridemia 08/04/2014  . Hypogonadism in male 08/04/2014  . Hypothyroidism 08/04/2014   Past Surgical History: Past Surgical History:  Procedure Laterality Date  . APPENDECTOMY    . SHOULDER ARTHROSCOPY    . TENDON REPAIR     Social History: Social History   Socioeconomic History  . Marital status: Married    Spouse name: Not on file  . Number of children: Not on file  . Years of education: Not on file  . Highest education level: Not on file  Occupational History  . Not on file  Social Needs  . Financial resource strain: Not on file  . Food insecurity:    Worry: Not on file    Inability: Not on file  . Transportation needs:    Medical: Not on file    Non-medical: Not on file  Tobacco Use  . Smoking status: Never Smoker  . Smokeless tobacco: Never Used  Substance and Sexual Activity  . Alcohol use: Not on file  . Drug use: Not on file  . Sexual activity: Not on file  Lifestyle  . Physical activity:    Days per week: Not on file    Minutes per session: Not on file  . Stress: Not on file  Relationships  . Social connections:    Talks on phone: Not on file    Gets together: Not on file    Attends religious service: Not on file    Active member of club or organization: Not on file    Attends meetings of clubs or organizations: Not on file    Relationship status: Not on file    Other Topics Concern  . Not on file  Social History Narrative  . Not on file   Family History: Family History  Problem Relation Age of Onset  . Other Neg Hx        hypogonadism   Allergies: Allergies  Allergen Reactions  . Augmentin [Amoxicillin-Pot Clavulanate]     rash  . Ciprofloxacin Other (See Comments)    Body aches  . Levofloxacin     Tendon Rupture  . Meperidine     Blood pressure drop  . Meperidine Hcl    Medications: See med rec.  Review of Systems: No fevers, chills, night sweats, weight loss, chest pain, or shortness of breath.   Objective:    General: Well Developed, well nourished, and in no acute distress.  Neuro: Alert and oriented x3, extra-ocular muscles intact, sensation grossly intact.  HEENT: Normocephalic, atraumatic, pupils equal round reactive to light, neck supple, no masses, no lymphadenopathy, thyroid nonpalpable.  Skin: Warm and dry, no rashes. Cardiac: Regular rate and rhythm, no murmurs rubs or gallops, no lower extremity edema.  Respiratory: Clear to auscultation bilaterally. Not using accessory muscles, speaking in full sentences.  Impression and Recommendations:    Secondary male hypogonadism Continue Clomid, rechecking FSH, LH, testosterone. If less than 300-400 we will increase to a full tab.  Essential hypertension Blood pressures continue to be borderline. Work aggressively on a low sodium diet, get back into exercising and send me blood pressure readings over the next couple of weeks.  Annual physical exam Flu shot today, colonoscopy scheduled for February 2020 ___________________________________________ Ihor Austinhomas J. Benjamin Stainhekkekandam, M.D., ABFM., CAQSM. Primary Care and Sports Medicine [facility]  Adjunct Professor of Family Medicine  [employer]

## 2018-08-23 NOTE — Assessment & Plan Note (Signed)
Flu shot today, colonoscopy scheduled for February 2020

## 2018-08-23 NOTE — Patient Instructions (Signed)
Work aggressively on a low sodium diet, get back into exercising and send me blood pressure readings over the next couple of weeks.

## 2018-08-23 NOTE — Assessment & Plan Note (Signed)
Blood pressures continue to be borderline. Work aggressively on a low sodium diet, get back into exercising and send me blood pressure readings over the next couple of weeks.

## 2018-08-23 NOTE — Assessment & Plan Note (Signed)
Continue Clomid, rechecking FSH, LH, testosterone. If less than 300-400 we will increase to a full tab.

## 2018-08-27 LAB — LUTEINIZING HORMONE: LH: 1.9 m[IU]/mL (ref 1.5–9.3)

## 2018-08-27 LAB — TESTOSTERONE, FREE & TOTAL
Free Testosterone: 83.1 pg/mL (ref 35.0–155.0)
Testosterone, Total, LC-MS-MS: 335 ng/dL (ref 250–1100)

## 2018-08-27 LAB — PSA, TOTAL AND FREE
PSA, % Free: 9 % (calc) — ABNORMAL LOW (ref >25)
PSA, Free: 0.3 ng/mL
PSA, Total: 3.2 ng/mL (ref <=4.0)

## 2018-08-27 LAB — FOLLICLE STIMULATING HORMONE: FSH: 2 m[IU]/mL (ref 1.6–8.0)

## 2018-08-28 DIAGNOSIS — E291 Testicular hypofunction: Secondary | ICD-10-CM

## 2018-08-28 MED ORDER — CLOMIPHENE CITRATE 50 MG PO TABS: 50.0000 mg | ORAL_TABLET | Freq: Every day | ORAL | 3 refills | Status: DC

## 2018-08-28 NOTE — Assessment & Plan Note (Signed)
Testosterone levels are still low, increase in Clomid to a full tab, recheck in 1 month.

## 2018-08-28 NOTE — Progress Notes (Signed)
Pt has seen results on MyChart and message also sent for patient to call back if any questions.

## 2018-10-07 ENCOUNTER — Other Ambulatory Visit: Payer: Self-pay | Admitting: Sports Medicine

## 2018-10-07 DIAGNOSIS — E781 Pure hyperglyceridemia: Secondary | ICD-10-CM

## 2018-10-07 MED ORDER — OMEGA-3-ACID ETHYL ESTERS 1 G PO CAPS: 2.0000 g | ORAL_CAPSULE | Freq: Two times a day (BID) | ORAL | 3 refills | Status: DC

## 2018-10-30 DIAGNOSIS — Z1211 Encounter for screening for malignant neoplasm of colon: Secondary | ICD-10-CM | POA: Diagnosis not present

## 2018-10-30 DIAGNOSIS — D122 Benign neoplasm of ascending colon: Secondary | ICD-10-CM | POA: Diagnosis not present

## 2018-10-30 DIAGNOSIS — K5730 Diverticulosis of large intestine without perforation or abscess without bleeding: Secondary | ICD-10-CM | POA: Diagnosis not present

## 2018-10-30 LAB — HM COLONOSCOPY

## 2018-10-31 DIAGNOSIS — M5431 Sciatica, right side: Secondary | ICD-10-CM | POA: Diagnosis not present

## 2019-01-09 ENCOUNTER — Encounter: Payer: Self-pay | Admitting: Sports Medicine

## 2019-01-09 NOTE — Telephone Encounter (Signed)
I think is fine to start with a video visit but let him know that if it does not clear up within a week after review the video visit then I would probably need to lay a stethoscope on his chest to make sure that we are not missing a pneumonia.  If he is okay with doing a visit in person then may be just schedule him for tomorrow.

## 2019-01-10 ENCOUNTER — Encounter: Payer: Self-pay | Admitting: Sports Medicine

## 2019-01-10 ENCOUNTER — Ambulatory Visit (INDEPENDENT_AMBULATORY_CARE_PROVIDER_SITE_OTHER): Payer: BLUE CROSS/BLUE SHIELD | Admitting: Sports Medicine

## 2019-01-10 DIAGNOSIS — J45909 Unspecified asthma, uncomplicated: Secondary | ICD-10-CM

## 2019-01-10 MED ORDER — MONTELUKAST SODIUM 10 MG PO TABS: 10.0000 mg | ORAL_TABLET | Freq: Every day | ORAL | 3 refills | Status: DC

## 2019-01-10 MED ORDER — FEXOFENADINE HCL 180 MG PO TABS: 180.0000 mg | ORAL_TABLET | Freq: Every day | ORAL | 3 refills | Status: DC

## 2019-01-10 MED ORDER — ALBUTEROL SULFATE HFA 108 (90 BASE) MCG/ACT IN AERS: 2.0000 | INHALATION_SPRAY | Freq: Four times a day (QID) | RESPIRATORY_TRACT | 11 refills | Status: DC | PRN

## 2019-01-10 NOTE — Assessment & Plan Note (Signed)
Mild symptoms, present for several months now. He will switch from the low-dose Allegra-D to full dose Allegra 180 mg. Adding Singulair, albuterol inhaler. If not significantly better in 2 weeks he will do a visit in person.

## 2019-01-10 NOTE — Progress Notes (Signed)
Virtual Visit via WebEx/MyChart   I connected with  Casey Elliott  on 01/10/19 via WebEx/MyChart/Doximity Video and verified that I am speaking with the correct person using two identifiers.   I discussed the limitations, risks, security and privacy concerns of performing an evaluation and management service by WebEx/MyChart/Doximity Video, including the higher likelihood of inaccurate diagnosis and treatment, and the availability of in person appointments.  We also discussed the likely need of an additional face to face encounter for complete and high quality delivery of care.  I also discussed with the patient that there may be a patient responsible charge related to this service. The patient expressed understanding and wishes to proceed.  Provider location is either at home or medical facility. Patient location is at their home, different from provider location. People involved in care of the patient during this telehealth encounter were myself, my nurse/medical assistant, and my front office/scheduling team member.  Subjective:    CC: Coughing  HPI: This is a pleasant 53 year old male, for the past several months now he has had a mild cough, dry, nonproductive, no chest pain, no shortness of breath.  Does not have a whole lot of runny or watery eyes, he tried some Allegra-D which only has 80 mg of fexofenadine and it.  Symptoms are mild, persistent.  No GI symptoms, no skin rash, no constitutional symptoms.  I reviewed the past medical history, family history, social history, surgical history, and allergies today and no changes were needed.  Please see the problem list section below in epic for further details.  Past Medical History: Past Medical History:  Diagnosis Date  . Chronic prostatitis 08/04/2014  . Hypertriglyceridemia 08/04/2014  . Hypogonadism in male 08/04/2014  . Hypothyroidism 08/04/2014   Past Surgical History: Past Surgical History:  Procedure Laterality Date   . APPENDECTOMY    . SHOULDER ARTHROSCOPY    . TENDON REPAIR     Social History: Social History   Socioeconomic History  . Marital status: Married    Spouse name: Not on file  . Number of children: Not on file  . Years of education: Not on file  . Highest education level: Not on file  Occupational History  . Not on file  Social Needs  . Financial resource strain: Not on file  . Food insecurity:    Worry: Not on file    Inability: Not on file  . Transportation needs:    Medical: Not on file    Non-medical: Not on file  Tobacco Use  . Smoking status: Never Smoker  . Smokeless tobacco: Never Used  Substance and Sexual Activity  . Alcohol use: Not on file  . Drug use: Not on file  . Sexual activity: Not on file  Lifestyle  . Physical activity:    Days per week: Not on file    Minutes per session: Not on file  . Stress: Not on file  Relationships  . Social connections:    Talks on phone: Not on file    Gets together: Not on file    Attends religious service: Not on file    Active member of club or organization: Not on file    Attends meetings of clubs or organizations: Not on file    Relationship status: Not on file  Other Topics Concern  . Not on file  Social History Narrative  . Not on file   Family History: Family History  Problem Relation Age of Onset  . Other  Neg Hx        hypogonadism   Allergies: Allergies  Allergen Reactions  . Augmentin [Amoxicillin-Pot Clavulanate]     rash  . Ciprofloxacin Other (See Comments)    Body aches  . Levofloxacin     Tendon Rupture  . Meperidine     Blood pressure drop  . Meperidine Hcl    Medications: See med rec.  Review of Systems: No fevers, chills, night sweats, weight loss, chest pain, or shortness of breath.   Objective:    General: Speaking full sentences, no audible heavy breathing.  Sounds alert and appropriately interactive.  Appears well.  Face symmetric.  Extraocular movements intact.  Pupils equal  and round.  No nasal flaring or accessory muscle use visualized.  No other physical exam performed due to the non-physical nature of this visit.  Impression and Recommendations:    Allergic bronchitis Mild symptoms, present for several months now. He will switch from the low-dose Allegra-D to full dose Allegra 180 mg. Adding Singulair, albuterol inhaler. If not significantly better in 2 weeks he will do a visit in person.  I discussed the above assessment and treatment plan with the patient. The patient was provided an opportunity to ask questions and all were answered. The patient agreed with the plan and demonstrated an understanding of the instructions.   The patient was advised to call back or seek an in-person evaluation if the symptoms worsen or if the condition fails to improve as anticipated.   I provided 25 minutes of non-face-to-face time during this encounter, 15 minutes of additional time was needed to gather information, review chart, records, communicate/coordinate with staff remotely, and complete documentation.   ___________________________________________ Ihor Austinhomas J. Benjamin Stainhekkekandam, M.D., ABFM., CAQSM. Primary Care and Sports Medicine [facility]  Adjunct Professor of Family Medicine  [employer]

## 2019-01-31 DIAGNOSIS — Z86010 Personal history of colonic polyps: Secondary | ICD-10-CM | POA: Diagnosis not present

## 2019-01-31 DIAGNOSIS — K219 Gastro-esophageal reflux disease without esophagitis: Secondary | ICD-10-CM | POA: Diagnosis not present

## 2019-02-05 ENCOUNTER — Encounter: Payer: Self-pay | Admitting: Sports Medicine

## 2019-02-05 MED ORDER — FLUTICASONE PROPIONATE 50 MCG/ACT NA SUSP: NASAL | 1 refills | Status: DC

## 2019-02-07 DIAGNOSIS — M76891 Other specified enthesopathies of right lower limb, excluding foot: Secondary | ICD-10-CM | POA: Diagnosis not present

## 2019-02-07 DIAGNOSIS — M1611 Unilateral primary osteoarthritis, right hip: Secondary | ICD-10-CM | POA: Diagnosis not present

## 2019-03-24 DIAGNOSIS — G5701 Lesion of sciatic nerve, right lower limb: Secondary | ICD-10-CM | POA: Diagnosis not present

## 2019-04-01 ENCOUNTER — Other Ambulatory Visit: Payer: Self-pay | Admitting: Sports Medicine

## 2019-04-11 DIAGNOSIS — G5701 Lesion of sciatic nerve, right lower limb: Secondary | ICD-10-CM | POA: Diagnosis not present

## 2019-06-13 IMAGING — DX DG LUMBAR SPINE COMPLETE 4+V
5 series · 5 of 5 positions shown · non-contrast
Comparison: 01/24/2018.

CLINICAL DATA: Lower back and hip pain.

EXAM:
LUMBAR SPINE - COMPLETE 4+ VIEW

[l-spine ap]
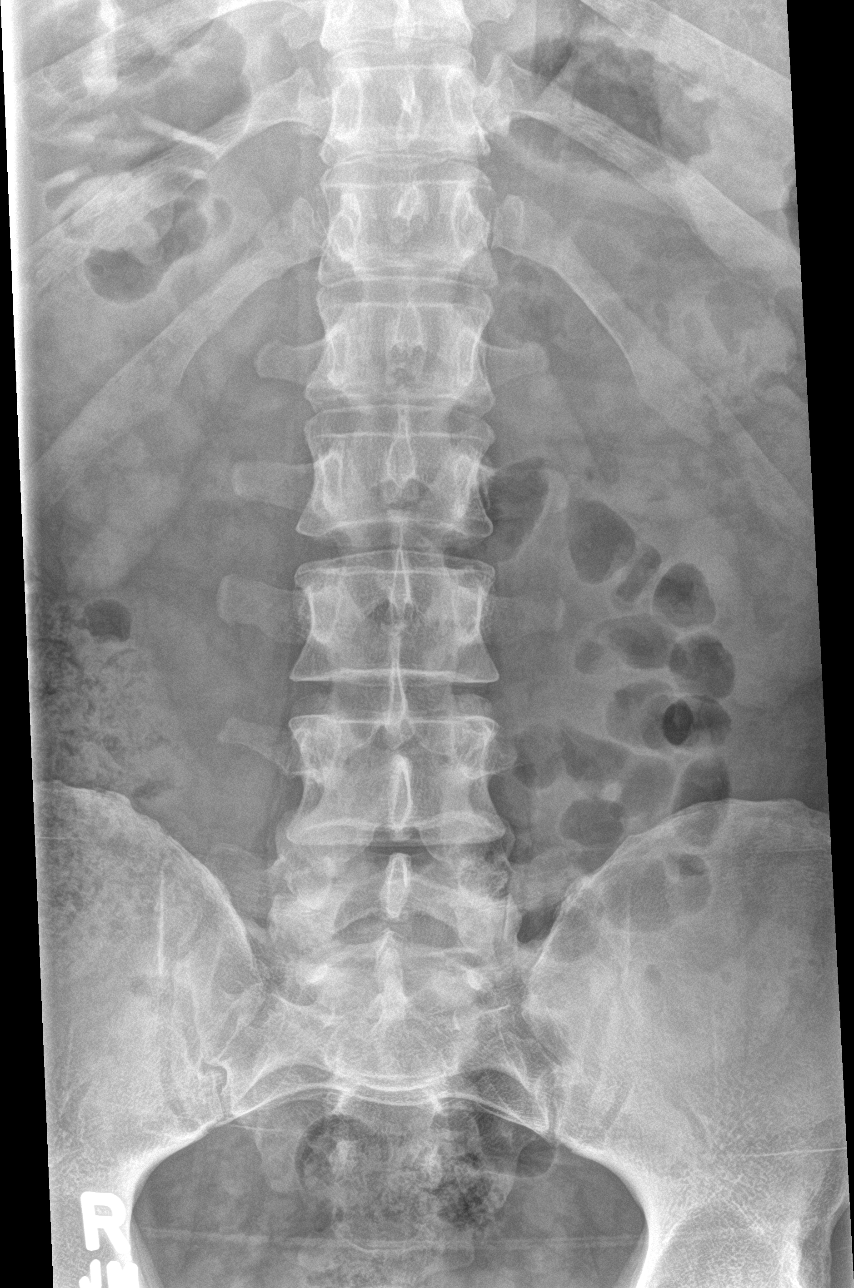

[l-spine obl (1 of 2)]
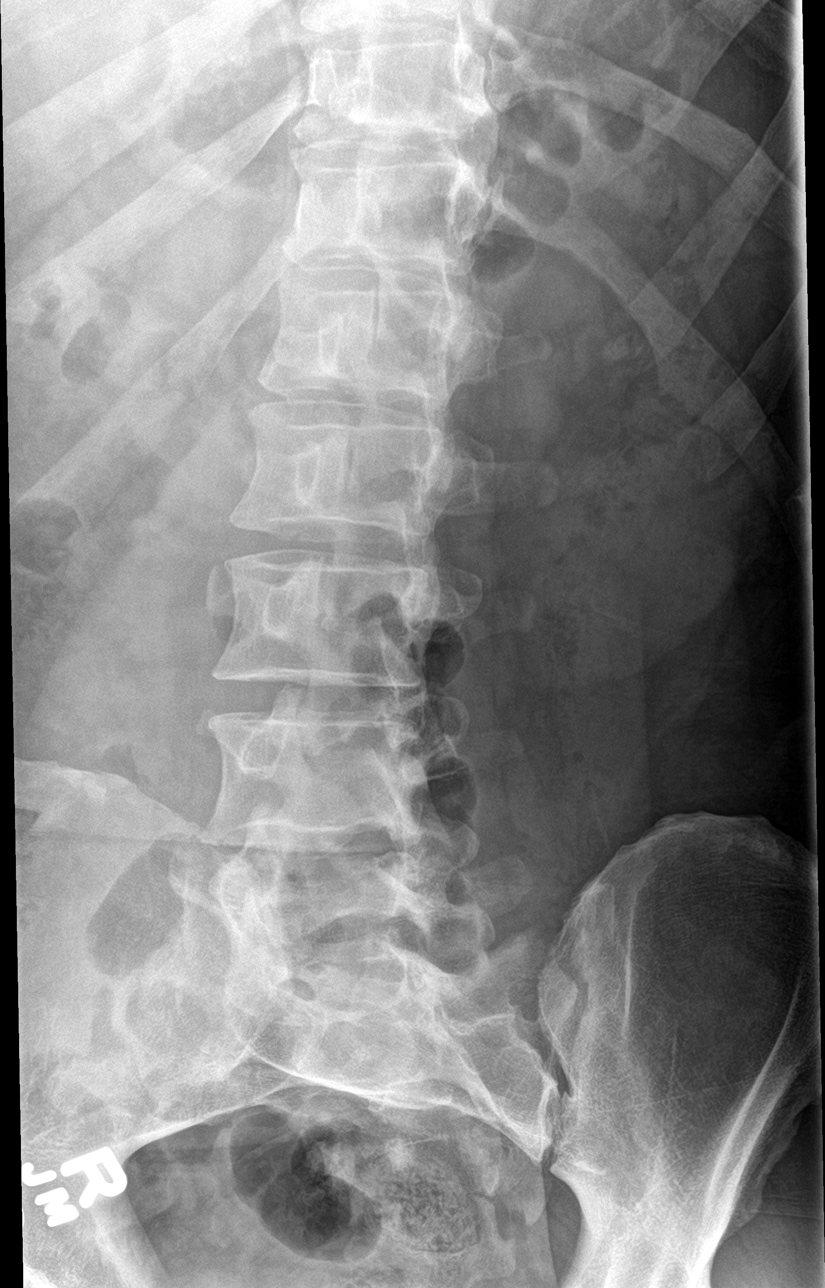

[l-spine obl (2 of 2)]
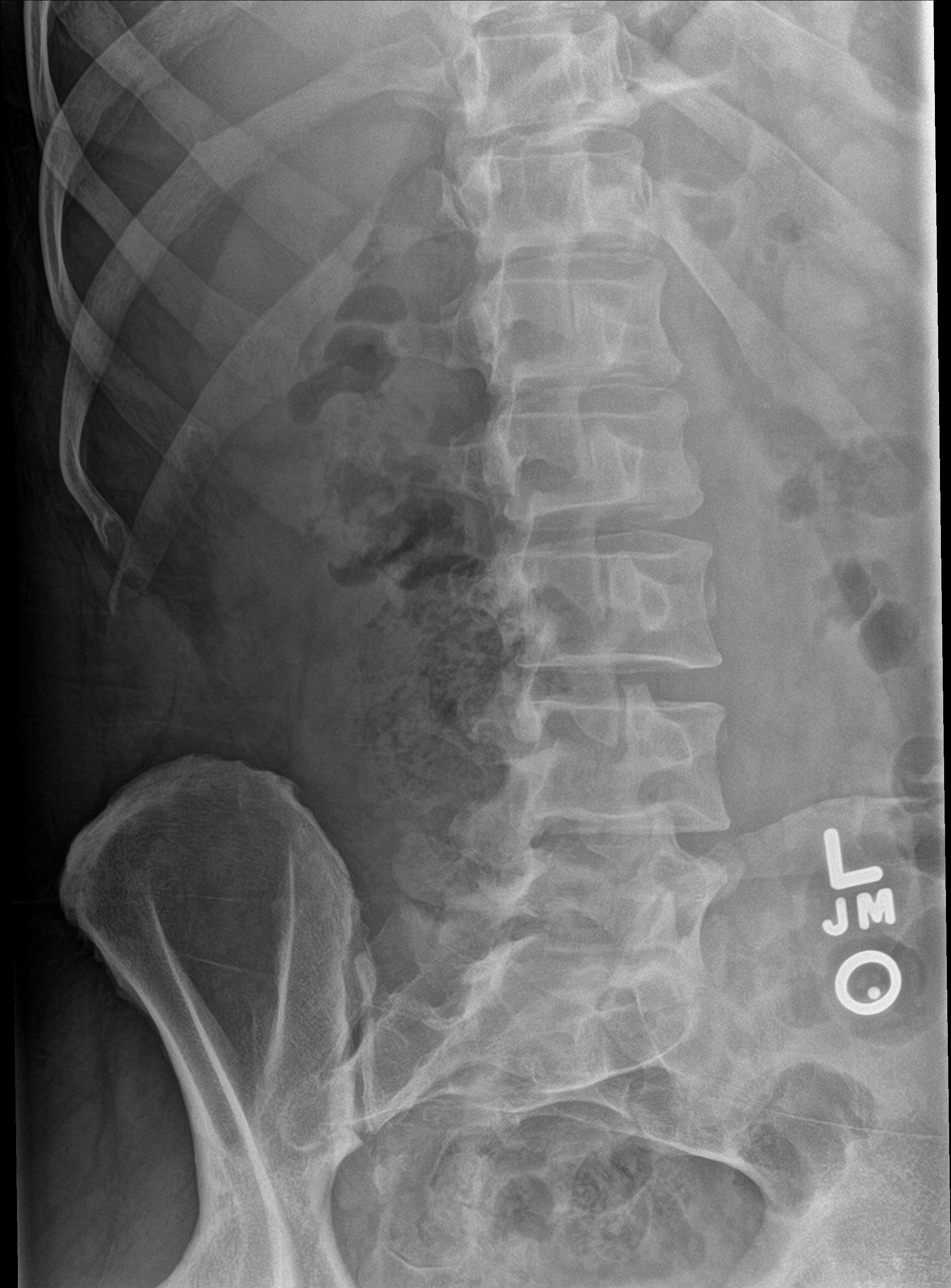

[l-spine lat]
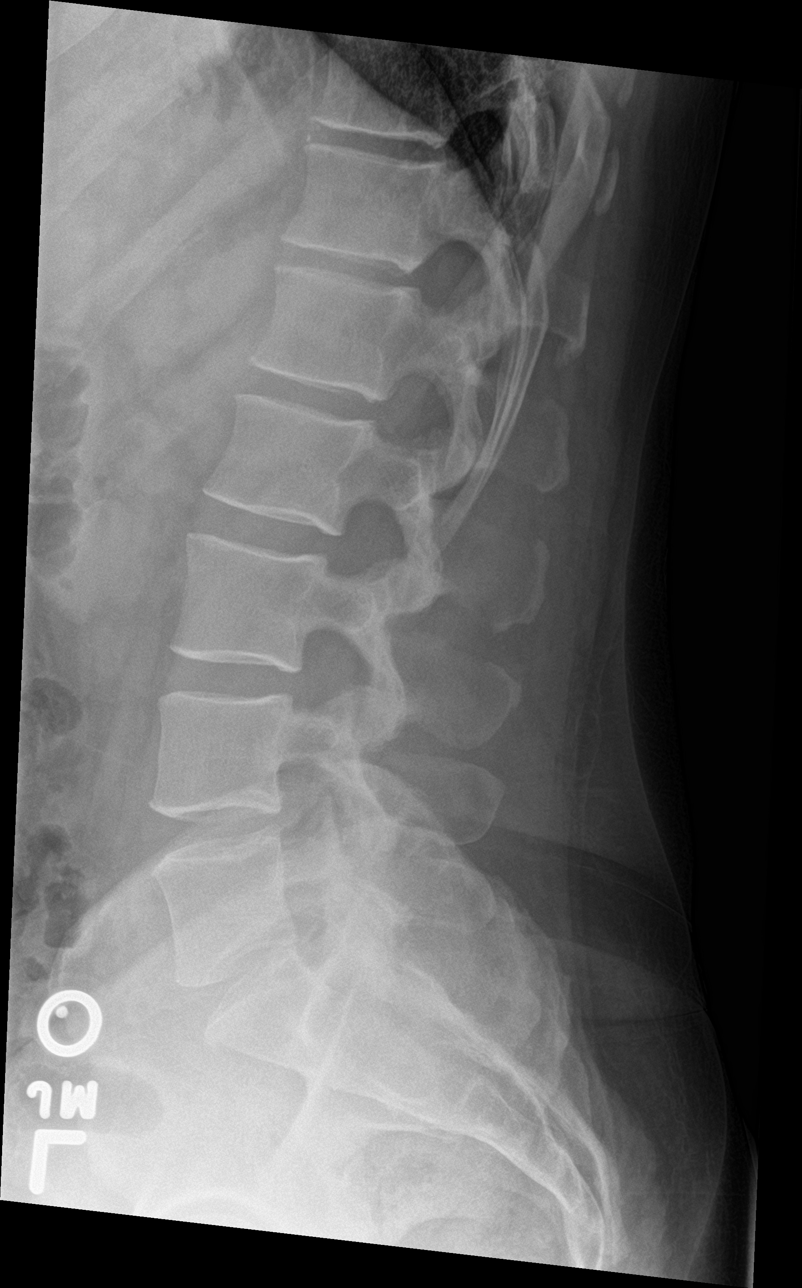

[l-spine spot]
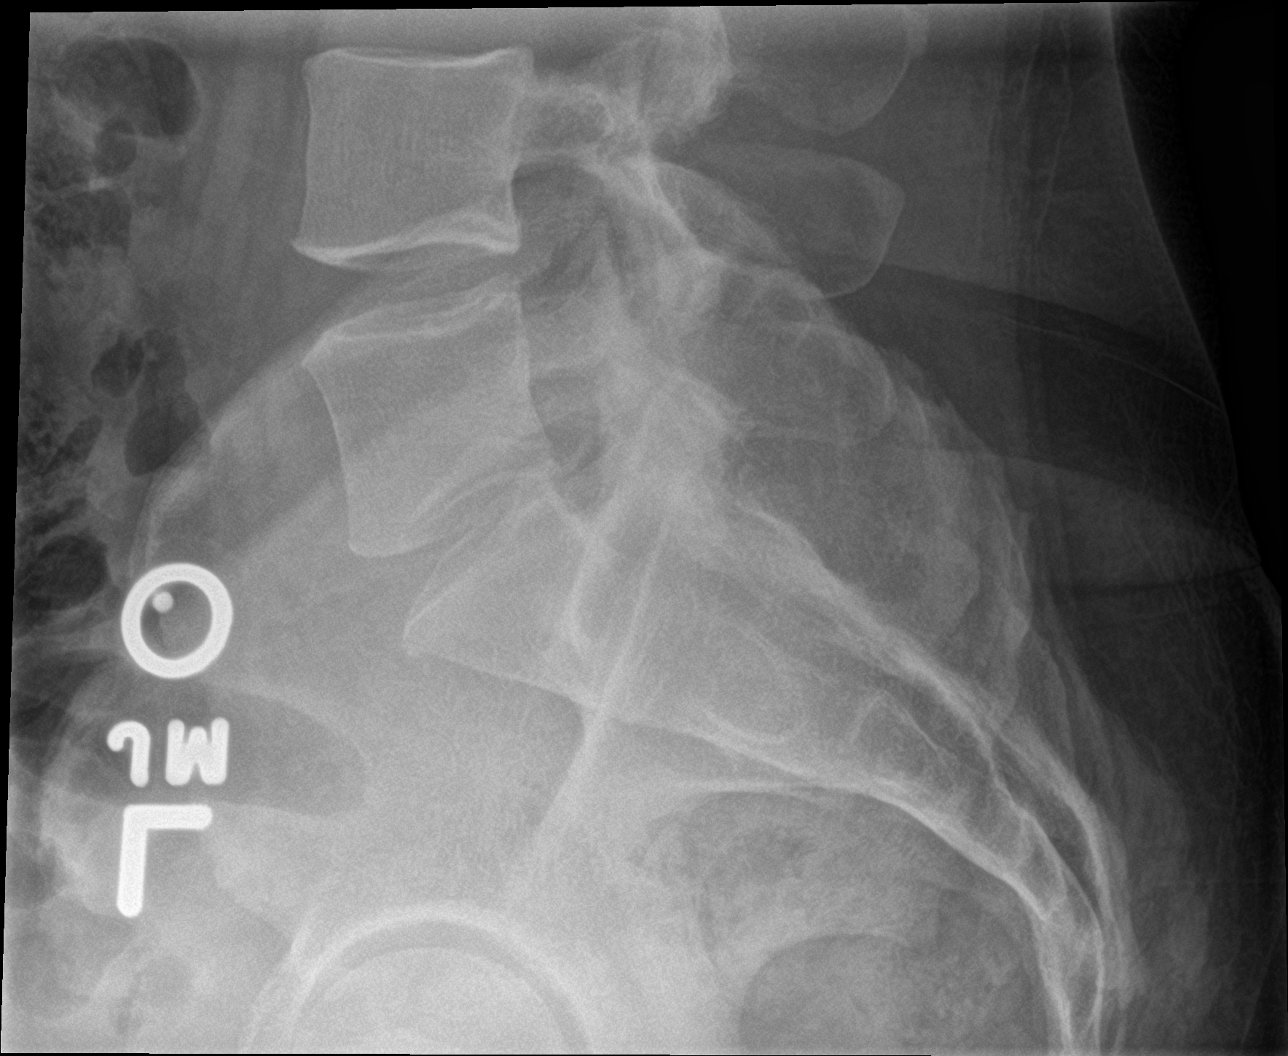

[5 of 5 positions shown; findings below may reference images not displayed]

FINDINGS: No acute bony abnormality. No evidence of fracture. Normal alignment
and mineralization. Soft tissues are unremarkable.
IMPRESSION: No acute or focal abnormality identified.

## 2019-08-19 ENCOUNTER — Other Ambulatory Visit: Payer: Self-pay | Admitting: Sports Medicine

## 2019-08-19 DIAGNOSIS — J45909 Unspecified asthma, uncomplicated: Secondary | ICD-10-CM

## 2019-08-19 MED ORDER — ALBUTEROL SULFATE HFA 108 (90 BASE) MCG/ACT IN AERS: 2.0000 | INHALATION_SPRAY | Freq: Four times a day (QID) | RESPIRATORY_TRACT | 3 refills | Status: DC | PRN

## 2019-08-19 MED ORDER — FLUTICASONE PROPIONATE 50 MCG/ACT NA SUSP: NASAL | 3 refills | Status: DC

## 2019-08-19 MED ORDER — MONTELUKAST SODIUM 10 MG PO TABS: 10.0000 mg | ORAL_TABLET | Freq: Every day | ORAL | 3 refills | Status: DC

## 2019-10-13 ENCOUNTER — Other Ambulatory Visit: Payer: Self-pay | Admitting: Sports Medicine

## 2019-10-13 DIAGNOSIS — E291 Testicular hypofunction: Secondary | ICD-10-CM

## 2019-10-13 MED ORDER — CLOMIPHENE CITRATE 50 MG PO TABS: 50.0000 mg | ORAL_TABLET | Freq: Every day | ORAL | 3 refills | Status: DC

## 2019-11-21 ENCOUNTER — Other Ambulatory Visit: Payer: Self-pay | Admitting: Sports Medicine

## 2019-11-21 DIAGNOSIS — E781 Pure hyperglyceridemia: Secondary | ICD-10-CM

## 2019-11-24 ENCOUNTER — Other Ambulatory Visit: Payer: Self-pay | Admitting: Sports Medicine

## 2019-11-24 DIAGNOSIS — J45909 Unspecified asthma, uncomplicated: Secondary | ICD-10-CM

## 2019-11-24 MED ORDER — MONTELUKAST SODIUM 10 MG PO TABS: 10.0000 mg | ORAL_TABLET | Freq: Every day | ORAL | 3 refills | Status: DC

## 2019-11-25 ENCOUNTER — Telehealth: Payer: Self-pay | Admitting: Sports Medicine

## 2019-11-25 NOTE — Telephone Encounter (Signed)
Received PA for Lovaza sent through cover my meds and received authorization.   Case ID: [id number] Valid: 10/26/2019 - 11/24/2022 - CF

## 2019-12-25 ENCOUNTER — Other Ambulatory Visit: Payer: Self-pay | Admitting: Sports Medicine

## 2019-12-25 DIAGNOSIS — J45909 Unspecified asthma, uncomplicated: Secondary | ICD-10-CM

## 2020-01-05 ENCOUNTER — Encounter: Payer: Self-pay | Admitting: Sports Medicine

## 2020-01-05 ENCOUNTER — Ambulatory Visit (INDEPENDENT_AMBULATORY_CARE_PROVIDER_SITE_OTHER): Payer: BC Managed Care – PPO | Admitting: Sports Medicine

## 2020-01-05 ENCOUNTER — Other Ambulatory Visit: Payer: Self-pay

## 2020-01-05 VITALS — BP 142/89 | HR 85 | Ht 72.0 in | Wt 224.0 lb

## 2020-01-05 DIAGNOSIS — Z23 Encounter for immunization: Secondary | ICD-10-CM

## 2020-01-05 DIAGNOSIS — E781 Pure hyperglyceridemia: Secondary | ICD-10-CM

## 2020-01-05 DIAGNOSIS — I10 Essential (primary) hypertension: Secondary | ICD-10-CM

## 2020-01-05 DIAGNOSIS — Z0000 Encounter for general adult medical examination without abnormal findings: Secondary | ICD-10-CM | POA: Diagnosis not present

## 2020-01-05 DIAGNOSIS — E291 Testicular hypofunction: Secondary | ICD-10-CM | POA: Diagnosis not present

## 2020-01-05 DIAGNOSIS — R7989 Other specified abnormal findings of blood chemistry: Secondary | ICD-10-CM

## 2020-01-05 MED ORDER — AMLODIPINE BESYLATE 5 MG PO TABS: 5.0000 mg | ORAL_TABLET | Freq: Every day | ORAL | 3 refills | Status: DC

## 2020-01-05 NOTE — Assessment & Plan Note (Addendum)
Rechecking lipids, he has been dieting.  Triglycerides continue to be elevated, Vascepa not covered, continue Lovaza and adding fenofibrate 160, recheck lipids in 2 to 3 months.

## 2020-01-05 NOTE — Assessment & Plan Note (Signed)
Rechecking testosterone, PSA 

## 2020-01-05 NOTE — Assessment & Plan Note (Signed)
Blood pressure continues to be elevated, he has worked on a low-sodium diet, he has been exercising, is doing the keto diet, losing weight. Adding amlodipine 5 mg daily, recheck blood pressure in a nurse visit check in 2 weeks.

## 2020-01-05 NOTE — Assessment & Plan Note (Signed)
Annual physical as above, checking routine labs.   Updated on colonoscopy, had it last year. Tdap today.

## 2020-01-05 NOTE — Progress Notes (Addendum)
Subjective:    CC: Annual Physical Exam  HPI:  This patient is here for their annual physical  I reviewed the past medical history, family history, social history, surgical history, and allergies today and no changes were needed.  Please see the problem list section below in epic for further details.  Past Medical History: Past Medical History:  Diagnosis Date  . Chronic prostatitis 08/04/2014  . Hypertriglyceridemia 08/04/2014  . Hypogonadism in male 08/04/2014  . Hypothyroidism 08/04/2014   Past Surgical History: Past Surgical History:  Procedure Laterality Date  . APPENDECTOMY    . SHOULDER ARTHROSCOPY    . TENDON REPAIR     Social History: Social History   Socioeconomic History  . Marital status: Married    Spouse name: Not on file  . Number of children: Not on file  . Years of education: Not on file  . Highest education level: Not on file  Occupational History  . Not on file  Tobacco Use  . Smoking status: Never Smoker  . Smokeless tobacco: Never Used  Substance and Sexual Activity  . Alcohol use: Not on file  . Drug use: Not on file  . Sexual activity: Not on file  Other Topics Concern  . Not on file  Social History Narrative  . Not on file   Social Determinants of Health   Financial Resource Strain:   . Difficulty of Paying Living Expenses:   Food Insecurity:   . Worried About Programme researcher, broadcasting/film/video in the Last Year:   . Barista in the Last Year:   Transportation Needs:   . Freight forwarder (Medical):   Marland Kitchen Lack of Transportation (Non-Medical):   Physical Activity:   . Days of Exercise per Week:   . Minutes of Exercise per Session:   Stress:   . Feeling of Stress :   Social Connections:   . Frequency of Communication with Friends and Family:   . Frequency of Social Gatherings with Friends and Family:   . Attends Religious Services:   . Active Member of Clubs or Organizations:   . Attends Banker Meetings:   Marland Kitchen Marital  Status:    Family History: Family History  Problem Relation Age of Onset  . Other Neg Hx        hypogonadism   Allergies: Allergies  Allergen Reactions  . Augmentin [Amoxicillin-Pot Clavulanate]     rash  . Ciprofloxacin Other (See Comments)    Body aches  . Levofloxacin     Tendon Rupture  . Meperidine     Blood pressure drop  . Meperidine Hcl    Medications: See med rec.  Review of Systems: No headache, visual changes, nausea, vomiting, diarrhea, constipation, dizziness, abdominal pain, skin rash, fevers, chills, night sweats, swollen lymph nodes, weight loss, chest pain, body aches, joint swelling, muscle aches, shortness of breath, mood changes, visual or auditory hallucinations.  Objective:    General: Well Developed, well nourished, and in no acute distress.  Neuro: Alert and oriented x3, extra-ocular muscles intact, sensation grossly intact. Cranial nerves II through XII are intact, motor, sensory, and coordinative functions are all intact. HEENT: Normocephalic, atraumatic, pupils equal round reactive to light, neck supple, no masses, no lymphadenopathy, thyroid nonpalpable. Oropharynx, nasopharynx, external ear canals are unremarkable. Skin: Warm and dry, no rashes noted.  Cardiac: Regular rate and rhythm, no murmurs rubs or gallops.  Respiratory: Clear to auscultation bilaterally. Not using accessory muscles, speaking in full sentences.  Abdominal: Soft, nontender, nondistended, positive bowel sounds, no masses, no organomegaly.  Musculoskeletal: Shoulder, elbow, wrist, hip, knee, ankle stable, and with full range of motion.  Impression and Recommendations:    The patient was counselled, risk factors were discussed, anticipatory guidance given.  Annual physical exam Annual physical as above, checking routine labs.   Updated on colonoscopy, had it last year. Tdap today.  Essential hypertension Blood pressure continues to be elevated, he has worked on a low-sodium  diet, he has been exercising, is doing the keto diet, losing weight. Adding amlodipine 5 mg daily, recheck blood pressure in a nurse visit check in 2 weeks.  Hypertriglyceridemia Rechecking lipids, he has been dieting.  Triglycerides continue to be elevated, Vascepa not covered, continue Lovaza and adding fenofibrate 160, recheck lipids in 2 to 3 months.  Secondary male hypogonadism Rechecking testosterone, PSA  Elevated TSH TSH still elevated, though this may have relation with Clomid treatment, I am going to add low-dose levothyroxine, 50 mcg and then recheck in 6 weeks, this would probably help his energy and weight loss as well.   ___________________________________________ Gwen Her. Dianah Field, M.D., ABFM., CAQSM. Primary Care and Sports Medicine [place]  Adjunct Professor of [employer]

## 2020-01-05 NOTE — Addendum Note (Signed)
Addended by: Donne Anon L on: 01/05/2020 11:10 AM   Modules accepted: Orders

## 2020-01-06 MED ORDER — FENOFIBRATE 160 MG PO TABS: 160.0000 mg | ORAL_TABLET | Freq: Every day | ORAL | 3 refills | Status: DC

## 2020-01-06 MED ORDER — VITAMIN D (ERGOCALCIFEROL) 1.25 MG (50000 UNIT) PO CAPS: 50000.0000 [IU] | ORAL_CAPSULE | ORAL | 0 refills | Status: DC

## 2020-01-06 MED ORDER — LEVOTHYROXINE SODIUM 50 MCG PO TABS: 50.0000 ug | ORAL_TABLET | Freq: Every day | ORAL | 3 refills | Status: DC

## 2020-01-06 NOTE — Assessment & Plan Note (Signed)
TSH still elevated, though this may have relation with Clomid treatment, I am going to add low-dose levothyroxine, 50 mcg and then recheck in 6 weeks, this would probably help his energy and weight loss as well.

## 2020-01-06 NOTE — Addendum Note (Signed)
Addended by: Monica Becton on: 01/06/2020 01:22 PM   Modules accepted: Orders

## 2020-01-08 LAB — COMPLETE METABOLIC PANEL WITH GFR
AG Ratio: 1.9 (calc) (ref 1.0–2.5)
ALT: 27 U/L (ref 9–46)
AST: 20 U/L (ref 10–35)
Albumin: 4.5 g/dL (ref 3.6–5.1)
Alkaline phosphatase (APISO): 55 U/L (ref 35–144)
BUN: 18 mg/dL (ref 7–25)
CO2: 29 mmol/L (ref 20–32)
Calcium: 9.5 mg/dL (ref 8.6–10.3)
Chloride: 105 mmol/L (ref 98–110)
Creat: 0.84 mg/dL (ref 0.70–1.33)
GFR, Est African American: 117 mL/min/{1.73_m2} (ref >=60)
GFR, Est Non African American: 101 mL/min/{1.73_m2} (ref >=60)
Globulin: 2.4 g/dL (calc) (ref 1.9–3.7)
Glucose, Bld: 83 mg/dL (ref 65–99)
Potassium: 4.4 mmol/L (ref 3.5–5.3)
Sodium: 141 mmol/L (ref 135–146)
Total Bilirubin: 0.5 mg/dL (ref 0.2–1.2)
Total Protein: 6.9 g/dL (ref 6.1–8.1)

## 2020-01-08 LAB — CBC
HCT: 46.2 % (ref 38.5–50.0)
Hemoglobin: 15.5 g/dL (ref 13.2–17.1)
MCH: 31.1 pg (ref 27.0–33.0)
MCHC: 33.5 g/dL (ref 32.0–36.0)
MCV: 92.8 fL (ref 80.0–100.0)
MPV: 9.8 fL (ref 7.5–12.5)
Platelets: 188 10*3/uL (ref 140–400)
RBC: 4.98 10*6/uL (ref 4.20–5.80)
RDW: 12.5 % (ref 11.0–15.0)
WBC: 3.3 10*3/uL — ABNORMAL LOW (ref 3.8–10.8)

## 2020-01-08 LAB — PSA, TOTAL AND FREE
PSA, % Free: 24 % (calc) — ABNORMAL LOW (ref >25)
PSA, Free: 0.4 ng/mL
PSA, Total: 1.7 ng/mL (ref <=4.0)

## 2020-01-08 LAB — LIPID PANEL W/REFLEX DIRECT LDL
Cholesterol: 184 mg/dL (ref <200)
HDL: 30 mg/dL — ABNORMAL LOW (ref >=40)
LDL Cholesterol (Calc): 115 mg/dL (calc) — ABNORMAL HIGH
Non-HDL Cholesterol (Calc): 154 mg/dL (calc) — ABNORMAL HIGH (ref <130)
Total CHOL/HDL Ratio: 6.1 (calc) — ABNORMAL HIGH (ref <5.0)
Triglycerides: 271 mg/dL — ABNORMAL HIGH (ref <150)

## 2020-01-08 LAB — HEMOGLOBIN A1C
Hgb A1c MFr Bld: 5 % of total Hgb (ref <5.7)
Mean Plasma Glucose: 97 (calc)
eAG (mmol/L): 5.4 (calc)

## 2020-01-08 LAB — TESTOSTERONE, FREE & TOTAL
Free Testosterone: 107.3 pg/mL (ref 35.0–155.0)
Testosterone, Total, LC-MS-MS: 643 ng/dL (ref 250–1100)

## 2020-01-08 LAB — VITAMIN D 25 HYDROXY (VIT D DEFICIENCY, FRACTURES): Vit D, 25-Hydroxy: 25 ng/mL — ABNORMAL LOW (ref 30–100)

## 2020-01-08 LAB — TSH: TSH: 4.51 mIU/L — ABNORMAL HIGH (ref 0.40–4.50)

## 2020-01-12 DIAGNOSIS — I10 Essential (primary) hypertension: Secondary | ICD-10-CM

## 2020-01-12 MED ORDER — LISINOPRIL 10 MG PO TABS: 10.0000 mg | ORAL_TABLET | Freq: Every day | ORAL | 3 refills | Status: DC

## 2020-01-12 NOTE — Assessment & Plan Note (Signed)
Blood pressures were elevated, he did not tolerate amlodipine, switching to lisinopril 10 mg daily. Recheck in a nurse visit in 2 weeks.

## 2020-01-20 ENCOUNTER — Ambulatory Visit: Payer: BC Managed Care – PPO

## 2020-01-21 ENCOUNTER — Other Ambulatory Visit: Payer: Self-pay

## 2020-01-21 ENCOUNTER — Ambulatory Visit (INDEPENDENT_AMBULATORY_CARE_PROVIDER_SITE_OTHER): Payer: BC Managed Care – PPO | Admitting: Sports Medicine

## 2020-01-21 DIAGNOSIS — I10 Essential (primary) hypertension: Secondary | ICD-10-CM

## 2020-01-21 MED ORDER — LISINOPRIL 20 MG PO TABS: 20.0000 mg | ORAL_TABLET | Freq: Every day | ORAL | 3 refills | Status: DC

## 2020-01-21 NOTE — Assessment & Plan Note (Signed)
Historically did not tolerate amlodipine, we switched to lisinopril. Blood pressures look okay, at home they run in the 120s to the 130s. Here in the office they were still in the 150s. Increasing lisinopril to 20 mg daily, recheck in a nurse visit in 2 weeks. Asymptomatic.

## 2020-01-21 NOTE — Progress Notes (Signed)
Patient comes in today for blood pressure check.   Casey Elliott is taking lisinopril 10 mg daily for blood pressure control. He denies any missed doses, side effects, headaches, dizziness, chest pain, palpitations, dizziness, or shortness of breath. He did bring his blood pressure cuff to compare to ours and it was found to be accurate.   At last visit on 01/05/2020 with Dr. Benjamin Stain his blood pressure continued to be elevated Amlodipine 5 mg was added daily and patient was instructed to recheck blood pressure with a nurse visit check in 2 weeks. He sent a message on 01/12/2020 and was not able to tolerate Amlodipine. He was switched at that time to Lisinopril.   Casey Elliott has been taking blood pressures at home. His readings are as follows:  01/19/2020: 134/73, HR 88 01/18/2020: 128/77, HR 73 01/17/2020: 123/80, HR 86 01/16/2020: 136/76, HR 88  His first blood pressure reading today is: 150/83, HR 78. After sitting, his second reading is: 148/81, HR 78. He states he does have a lot of stress going on with remodeling of his kitchen and he thinks increased blood pressures are related to stress. He would like to stay on this current dose of medication due to no side effects and he feels like it is working well for him.    I spoke with Dr. Benjamin Stain who advised to increase Lisinopril to 20 mg daily for a goal of under 120/80. Patient had already left and I let him know I would call him with instructions. Called patient and had to leave a message for him to call back. Will advise  patient to increase lisinopril to 20 mg daily, follow up in two weeks, and continue to check blood pressures at home.

## 2020-01-22 ENCOUNTER — Encounter: Payer: Self-pay | Admitting: Neurology

## 2020-01-22 DIAGNOSIS — I10 Essential (primary) hypertension: Secondary | ICD-10-CM

## 2020-01-22 NOTE — Telephone Encounter (Signed)
Mychart message sent to patient.

## 2020-01-22 NOTE — Assessment & Plan Note (Signed)
BP ok today on 10mg . We will leave this alone for now. (Back to 10mg  lisinopril)

## 2020-02-21 ENCOUNTER — Other Ambulatory Visit: Payer: Self-pay | Admitting: Sports Medicine

## 2020-04-23 ENCOUNTER — Ambulatory Visit: Payer: BC Managed Care – PPO | Admitting: Nurse Practitioner

## 2020-04-23 ENCOUNTER — Encounter: Payer: Self-pay | Admitting: Nurse Practitioner

## 2020-04-23 VITALS — BP 150/75 | HR 76 | Ht 72.0 in | Wt 216.0 lb

## 2020-04-23 DIAGNOSIS — L03213 Periorbital cellulitis: Secondary | ICD-10-CM | POA: Diagnosis not present

## 2020-04-23 DIAGNOSIS — E781 Pure hyperglyceridemia: Secondary | ICD-10-CM | POA: Diagnosis not present

## 2020-04-23 DIAGNOSIS — R7989 Other specified abnormal findings of blood chemistry: Secondary | ICD-10-CM | POA: Diagnosis not present

## 2020-04-23 DIAGNOSIS — J45909 Unspecified asthma, uncomplicated: Secondary | ICD-10-CM | POA: Diagnosis not present

## 2020-04-23 DIAGNOSIS — I10 Essential (primary) hypertension: Secondary | ICD-10-CM

## 2020-04-23 MED ORDER — CEFDINIR 300 MG PO CAPS: 300.0000 mg | ORAL_CAPSULE | Freq: Two times a day (BID) | ORAL | 0 refills | Status: DC

## 2020-04-23 MED ORDER — SULFAMETHOXAZOLE-TRIMETHOPRIM 800-160 MG PO TABS: 1.0000 | ORAL_TABLET | Freq: Two times a day (BID) | ORAL | 0 refills | Status: DC

## 2020-04-23 NOTE — Progress Notes (Signed)
Acute Office Visit  Subjective:    Patient ID: Casey Elliott, male    DOB: 04-Jul-1966, 54 y.o.   MRN: [id number]  CC: Stye  HPI Patient is in today for redness and irritation to this left eye that started last weekend. He thought a stye was forming and has been using warm compresses to the area, but it continued to become more red and inflamed around the eye lid. He did report applying pressure to the area yesterday to try to facilitate drainage. When he woke this morning his lower eyelid and the area under his eye was very red and swollen.     Past Medical History:  Diagnosis Date  . Chronic prostatitis 08/04/2014  . Hypertriglyceridemia 08/04/2014  . Hypogonadism in male 08/04/2014  . Hypothyroidism 08/04/2014    Past Surgical History:  Procedure Laterality Date  . APPENDECTOMY    . SHOULDER ARTHROSCOPY    . TENDON REPAIR      Family History  Problem Relation Age of Onset  . Other Neg Hx        hypogonadism    Social History   Socioeconomic History  . Marital status: Married    Spouse name: Not on file  . Number of children: Not on file  . Years of education: Not on file  . Highest education level: Not on file  Occupational History  . Not on file  Tobacco Use  . Smoking status: Never Smoker  . Smokeless tobacco: Never Used  Substance and Sexual Activity  . Alcohol use: Not on file  . Drug use: Not on file  . Sexual activity: Not on file  Other Topics Concern  . Not on file  Social History Narrative  . Not on file   Social Determinants of Health   Financial Resource Strain:   . Difficulty of Paying Living Expenses:   Food Insecurity:   . Worried About Programme researcher, broadcasting/film/video in the Last Year:   . Barista in the Last Year:   Transportation Needs:   . Freight forwarder (Medical):   Marland Kitchen Lack of Transportation (Non-Medical):   Physical Activity:   . Days of Exercise per Week:   . Minutes of Exercise per Session:   Stress:   . Feeling of Stress  :   Social Connections:   . Frequency of Communication with Friends and Family:   . Frequency of Social Gatherings with Friends and Family:   . Attends Religious Services:   . Active Member of Clubs or Organizations:   . Attends Banker Meetings:   Marland Kitchen Marital Status:   Intimate Partner Violence:   . Fear of Current or Ex-Partner:   . Emotionally Abused:   Marland Kitchen Physically Abused:   . Sexually Abused:     Outpatient Medications Prior to Visit  Medication Sig Dispense Refill  . albuterol (VENTOLIN HFA) 108 (90 Base) MCG/ACT inhaler Inhale 2 puffs into the lungs every 6 (six) hours as needed for wheezing. 18 g 3  . clomiPHENE (CLOMID) 50 MG tablet Take 1 tablet (50 mg total) by mouth daily. 90 tablet 3  . fenofibrate 160 MG tablet Take 1 tablet (160 mg total) by mouth daily. 90 tablet 3  . fexofenadine (ALLEGRA) 180 MG tablet TAKE 1 TABLET BY MOUTH EVERY DAY 30 tablet 11  . fluticasone (FLONASE) 50 MCG/ACT nasal spray ADMINISTER 2 SPRAYS INTO EACH NOSTRIL ONCE DAILY 48 mL 3  . levothyroxine (SYNTHROID) 50 MCG tablet Take 1  tablet (50 mcg total) by mouth daily. 90 tablet 3  . lisinopril (ZESTRIL) 10 MG tablet Take 1 tablet (10 mg total) by mouth daily.    . montelukast (SINGULAIR) 10 MG tablet Take 1 tablet (10 mg total) by mouth at bedtime. 90 tablet 3  . omega-3 acid ethyl esters (LOVAZA) 1 g capsule TAKE 2 CAPSULES TWICE A DAY 360 capsule 3  . sildenafil (REVATIO) 20 MG tablet Take 1-5 tablets (20-100 mg total) by mouth as needed. 50 tablet 11  . Vitamin D, Ergocalciferol, (DRISDOL) 1.25 MG (50000 UNIT) CAPS capsule Take 1 capsule (50,000 Units total) by mouth every 7 (seven) days. Take for 8 total doses(weeks) 8 capsule 0   No facility-administered medications prior to visit.    Allergies  Allergen Reactions  . Augmentin [Amoxicillin-Pot Clavulanate]     rash  . Ciprofloxacin Other (See Comments)    Body aches  . Levofloxacin     Tendon Rupture  . Meperidine     Blood  pressure drop  . Meperidine Hcl       Objective:    Physical Exam Vitals and nursing note reviewed.  Constitutional:      Appearance: Normal appearance. He is normal weight.  HENT:     Head: Normocephalic.     Mouth/Throat:     Mouth: Mucous membranes are moist.     Pharynx: Oropharynx is clear.  Eyes:     General:        Left eye: Hordeolum present.    Extraocular Movements: Extraocular movements intact.     Conjunctiva/sclera: Conjunctivae normal.     Pupils: Pupils are equal, round, and reactive to light.   Cardiovascular:     Rate and Rhythm: Normal rate.     Pulses: Normal pulses.  Pulmonary:     Effort: Pulmonary effort is normal.  Skin:    Capillary Refill: Capillary refill takes less than 2 seconds.  Neurological:     General: No focal deficit present.     Mental Status: He is alert and oriented to person, place, and time.  Psychiatric:        Mood and Affect: Mood normal.        Behavior: Behavior normal.        Thought Content: Thought content normal.        Judgment: Judgment normal.     BP (!) 150/75   Pulse 76   Ht 6' (1.829 m)   Wt 216 lb (98 kg)   BMI 29.29 kg/m  Wt Readings from Last 3 Encounters:  04/23/20 216 lb (98 kg)  01/05/20 224 lb (101.6 kg)  01/10/19 219 lb (99.3 kg)    Health Maintenance Due  Topic Date Due  . Hepatitis C Screening  Never done  . INFLUENZA VACCINE  04/18/2020    There are no preventive care reminders to display for this patient.   Lab Results  Component Value Date   TSH 4.51 (H) 01/05/2020   Lab Results  Component Value Date   WBC 3.3 (L) 01/05/2020   HGB 15.5 01/05/2020   HCT 46.2 01/05/2020   MCV 92.8 01/05/2020   PLT 188 01/05/2020   Lab Results  Component Value Date   NA 141 01/05/2020   K 4.4 01/05/2020   CO2 29 01/05/2020   GLUCOSE 83 01/05/2020   BUN 18 01/05/2020   CREATININE 0.84 01/05/2020   BILITOT 0.5 01/05/2020   ALKPHOS 58 08/20/2014   AST 20 01/05/2020   ALT 27  01/05/2020    PROT 6.9 01/05/2020   ALBUMIN 4.3 12/25/2016   CALCIUM 9.5 01/05/2020   Lab Results  Component Value Date   CHOL 184 01/05/2020   Lab Results  Component Value Date   HDL 30 (L) 01/05/2020   Lab Results  Component Value Date   LDLCALC 115 (H) 01/05/2020   Lab Results  Component Value Date   TRIG 271 (H) 01/05/2020   Lab Results  Component Value Date   CHOLHDL 6.1 (H) 01/05/2020   Lab Results  Component Value Date   HGBA1C 5.0 01/05/2020       Assessment & Plan:   1. Preseptal cellulitis of right lower eyelid Symptoms and presentation consistent with preseptal cellulitis due to presence of hordeolum on the left lower eyelid. Will treat with antibiotics given the amount of erythema and edema present.  Continue to use warm compress to area multiple times a day Do not try to squeeze - cefdinir (OMNICEF) 300 MG capsule; Take 1 capsule (300 mg total) by mouth 2 (two) times daily.  Dispense: 10 capsule; Refill: 0 - sulfamethoxazole-trimethoprim (BACTRIM DS) 800-160 MG tablet; Take 1 tablet by mouth 2 (two) times daily.  Dispense: 10 tablet; Refill: 0  2. Essential hypertension Blood pressure well controlled at home- is elevated in the office today and on other visit. Suspect white coat hypertension.  Monitor blood pressure at home and notify the office of readings >150/90 twice in a row CMP today to monitor kidney and electrolyte function  - COMPLETE METABOLIC PANEL WITH GFR  3. Bronchitis, allergic, unspecified asthma severity, uncomplicated Recent illness-recovered- requesting COVID testing.  - SARS CoV2 Serology(COVID19) AB(IgG,IgM),Immunoassay  Follow-up if symptoms worsen or fail to improve.  Tollie Eth, NP

## 2020-04-23 NOTE — Patient Instructions (Addendum)
Keep using the warm, moist compresses placed on the affected areas frequently (eg, for 5 to 10 minutes three to five times per day) to facilitate drainage.  Massage and gentle wiping of the affected eyelid after the warm compress can also aid in drainage.   Preseptal Cellulitis, Adult  Preseptal cellulitis is an infection of the eyelid and the tissues around the eye (periorbital area). The infection causes painful swelling and redness. This condition may also be called periorbital cellulitis. In most cases, the condition can be treated with antibiotic medicine at home. It is important to treat preseptal cellulitis right away so that it does not get worse. If it gets worse, it can spread to the eye socket and eye muscles (orbital cellulitis). Orbital cellulitis is a medical emergency. What are the causes? Preseptal cellulitis is most commonly caused by bacteria. In rare cases, it can be caused by a virus or fungus. The germs that cause preseptal cellulitis may come from:  A sinus infection that spreads near the eyes.  An injury near the eye, such as a scratch, animal bite, or insect bite.  A skin rash that becomes infected, such as eczema or poison ivy.  An infected pimple on the eyelid (stye).  Infection after eyelid surgery or injury. What increases the risk? You are more likely to develop this condition if:  You have a weakened disease-fighting system (immune system).  You have a medical condition that raises your risk for sinus infections, such as nasal polyps. What are the signs or symptoms? Symptoms of this condition usually develop suddenly. Symptoms may include:  Eyelids that are red and swollen and feel unusually hot.  Fever.  Difficulty opening the eye.  Headache.  Facial pain. How is this diagnosed? This condition may be diagnosed based on your symptoms, your medical history, and an eye exam. You may have tests, such as:  Blood tests.  CT scan.  MRI. How is  this treated? This condition is treated with antibiotic medicines. These may be given by mouth (orally), through an IV, or as a shot. In rare cases, you may need surgery to drain an infected area. Follow these instructions at home: Medicines  If you were prescribed an antibiotic to take at home, take it as told by your health care provider. Do not stop taking the antibiotic even if you start to feel better.  Take over-the-counter and prescription medicines only as told by your health care provider. Eye Care  Do not use eye drops without first getting approval from your health care provider.  Do not touch or rub your eye. If you wear contact lenses, do not wear them until your health care provider approves.  Keep the eye area clean and dry.  Wash the eye area with a clean washcloth, warm water, and baby shampoo or mild soap.  To help relieve discomfort, place a clean washcloth that is wet with warm water over your eye. Leave the washcloth on for a few minutes, then remove it. General instructions  Wash your hands with soap and water often. If soap and water are not available, use hand sanitizer.  Do not use any products that contain nicotine or tobacco, such as cigarettes and e-cigarettes. If you need help quitting, ask your health care provider.  Drink enough fluid to keep your urine pale yellow.  Ask your health care provider if it is safe for you to drive.  Stay up to date on your vaccinations.  Keep all follow-up visits as  told by your health care provider. This includes any visits with an eye specialist (ophthalmologist) or dentist. This is important. Get help right away if:  You have new symptoms.  Your symptoms get worse or do not get better with treatment.  You have a fever.  Your vision becomes blurry or gets worse in any way.  Your eye looks like it is sticking out or bulging out (proptosis).  You have trouble moving your eyes.  You have a severe  headache.  You have neck stiffness or severe neck pain. Summary  Preseptal cellulitis is an infection of the eyelid and the tissues around the eye.  Symptoms of preseptal cellulitis usually develop suddenly and include red and swollen eyelids, fever, difficulty opening the eye, headache, and facial pain.  This condition is treated with antibiotic medicines. Do not stop taking the antibiotic even if you start to feel better. This information is not intended to replace advice given to you by your health care provider. Make sure you discuss any questions you have with your health care provider. Document Revised: 08/17/2017 Document Reviewed: 06/27/2017 Elsevier Patient Education  2020 [employer].

## 2020-04-24 LAB — LIPID PANEL W/REFLEX DIRECT LDL
Cholesterol: 192 mg/dL (ref <200)
HDL: 37 mg/dL — ABNORMAL LOW (ref >=40)
LDL Cholesterol (Calc): 130 mg/dL (calc) — ABNORMAL HIGH
Non-HDL Cholesterol (Calc): 155 mg/dL (calc) — ABNORMAL HIGH (ref <130)
Total CHOL/HDL Ratio: 5.2 (calc) — ABNORMAL HIGH (ref <5.0)
Triglycerides: 141 mg/dL (ref <150)

## 2020-04-24 LAB — T4, FREE: Free T4: 1.3 ng/dL (ref 0.8–1.8)

## 2020-04-24 LAB — COMPLETE METABOLIC PANEL WITH GFR
AG Ratio: 2 (calc) (ref 1.0–2.5)
ALT: 20 U/L (ref 9–46)
AST: 19 U/L (ref 10–35)
Albumin: 4.7 g/dL (ref 3.6–5.1)
Alkaline phosphatase (APISO): 44 U/L (ref 35–144)
BUN: 20 mg/dL (ref 7–25)
CO2: 26 mmol/L (ref 20–32)
Calcium: 9.9 mg/dL (ref 8.6–10.3)
Chloride: 104 mmol/L (ref 98–110)
Creat: 0.98 mg/dL (ref 0.70–1.33)
GFR, Est African American: 103 mL/min/{1.73_m2} (ref >=60)
GFR, Est Non African American: 89 mL/min/{1.73_m2} (ref >=60)
Globulin: 2.3 g/dL (calc) (ref 1.9–3.7)
Glucose, Bld: 81 mg/dL (ref 65–139)
Potassium: 3.9 mmol/L (ref 3.5–5.3)
Sodium: 142 mmol/L (ref 135–146)
Total Bilirubin: 0.6 mg/dL (ref 0.2–1.2)
Total Protein: 7 g/dL (ref 6.1–8.1)

## 2020-04-24 LAB — T3, FREE: T3, Free: 3.4 pg/mL (ref 2.3–4.2)

## 2020-04-24 LAB — TSH: TSH: 2.69 mIU/L (ref 0.40–4.50)

## 2020-04-26 LAB — SARS COV-2 SEROLOGY(COVID-19)AB(IGG,IGM),IMMUNOASSAY
SARS CoV-2 AB IgG: NEGATIVE
SARS CoV-2 IgM: NEGATIVE

## 2020-04-26 NOTE — Progress Notes (Signed)
Labs look great.  Kidney and liver function perfect.  No elevation in blood sugars.  All electrolytes normal.   Still waiting on the antibody testing.

## 2020-04-26 NOTE — Progress Notes (Signed)
Casey Elliott,   There was no immune response to the covid antibody testing, meaning that you do not have antibodies present for the virus. This means that you have no had the virus and are not protected against the virus.   Please be sure to social distance and wear a mask when you are in public to protect yourself against the virus.

## 2020-05-22 ENCOUNTER — Other Ambulatory Visit: Payer: Self-pay | Admitting: Sports Medicine

## 2020-05-22 DIAGNOSIS — I10 Essential (primary) hypertension: Secondary | ICD-10-CM

## 2020-07-13 ENCOUNTER — Ambulatory Visit: Payer: BC Managed Care – PPO | Admitting: Sports Medicine

## 2020-07-13 ENCOUNTER — Ambulatory Visit (INDEPENDENT_AMBULATORY_CARE_PROVIDER_SITE_OTHER): Payer: BC Managed Care – PPO

## 2020-07-13 ENCOUNTER — Other Ambulatory Visit: Payer: Self-pay

## 2020-07-13 ENCOUNTER — Encounter: Payer: Self-pay | Admitting: Sports Medicine

## 2020-07-13 ENCOUNTER — Telehealth: Payer: Self-pay | Admitting: Family Medicine

## 2020-07-13 DIAGNOSIS — G8929 Other chronic pain: Secondary | ICD-10-CM

## 2020-07-13 DIAGNOSIS — M19012 Primary osteoarthritis, left shoulder: Secondary | ICD-10-CM | POA: Insufficient documentation

## 2020-07-13 DIAGNOSIS — M25512 Pain in left shoulder: Secondary | ICD-10-CM

## 2020-07-13 NOTE — Progress Notes (Signed)
    Procedures performed today:    Procedure: Real-time Ultrasound Guided injection of the left glenohumeral joint Device: Samsung [id number]  Verbal informed consent obtained.  Time-out conducted.  Noted no overlying erythema, induration, or other signs of local infection.  Skin prepped in a sterile fashion.  Local anesthesia: Topical Ethyl chloride.  With sterile technique and under real time ultrasound guidance:  Noted osteoarthritis, 1 cc Kenalog 40, 2 cc lidocaine, 2 cc bupivacaine injected easily through a 22-gauge spinal needle from a posterior approach Completed without difficulty  Pain immediately resolved suggesting accurate placement of the medication.  Advised to call if fevers/chills, erythema, induration, drainage, or persistent bleeding.  Images permanently stored and available for review in PACS.  Impression: Technically successful ultrasound guided injection.  Independent interpretation of notes and tests performed by another provider:   None.  Brief History, Exam, Impression, and Recommendations:    Chronic left shoulder pain This is a pleasant 63 male, he had a history of a shoulder dislocation playing hockey 15 years ago. He ended up with rotator cuff tear and a labral tear, repaired arthroscopically. He did well until recently, for the past several months has had increasing loss of range of motion, difficulty with abduction, external rotation and it is waking him from sleep. On exam he has significant loss of external rotation consistent with adhesive capsulitis/osteoarthritis. Glenohumeral joint injection today, x-rays, formal physical therapy, return to see me in 4 weeks. MR arthrogram if no better.    ___________________________________________ Ihor Austin. Benjamin Stain, M.D., ABFM., CAQSM. Primary Care and Sports Medicine [facility]  Adjunct Instructor of Family Medicine  [employer]

## 2020-07-13 NOTE — Assessment & Plan Note (Signed)
This is a pleasant 57 male, he had a history of a shoulder dislocation playing hockey 15 years ago. He ended up with rotator cuff tear and a labral tear, repaired arthroscopically. He did well until recently, for the past several months has had increasing loss of range of motion, difficulty with abduction, external rotation and it is waking him from sleep. On exam he has significant loss of external rotation consistent with adhesive capsulitis/osteoarthritis. Glenohumeral joint injection today, x-rays, formal physical therapy, return to see me in 4 weeks. MR arthrogram if no better.

## 2020-07-29 ENCOUNTER — Ambulatory Visit: Payer: BC Managed Care – PPO | Admitting: Rehabilitative and Restorative Service Providers"

## 2020-08-05 ENCOUNTER — Ambulatory Visit: Payer: BC Managed Care – PPO | Admitting: Rehabilitative and Restorative Service Providers"

## 2020-08-06 DIAGNOSIS — M25512 Pain in left shoulder: Secondary | ICD-10-CM

## 2020-08-06 DIAGNOSIS — G8929 Other chronic pain: Secondary | ICD-10-CM

## 2020-08-10 ENCOUNTER — Ambulatory Visit: Payer: BC Managed Care – PPO | Admitting: Sports Medicine

## 2020-08-16 ENCOUNTER — Ambulatory Visit: Payer: BC Managed Care – PPO | Admitting: Rehabilitative and Restorative Service Providers"

## 2020-08-17 DIAGNOSIS — M7502 Adhesive capsulitis of left shoulder: Secondary | ICD-10-CM | POA: Diagnosis not present

## 2020-08-17 DIAGNOSIS — M25612 Stiffness of left shoulder, not elsewhere classified: Secondary | ICD-10-CM | POA: Diagnosis not present

## 2020-08-17 DIAGNOSIS — M25512 Pain in left shoulder: Secondary | ICD-10-CM | POA: Diagnosis not present

## 2020-08-27 DIAGNOSIS — M7502 Adhesive capsulitis of left shoulder: Secondary | ICD-10-CM | POA: Diagnosis not present

## 2020-08-27 DIAGNOSIS — M25512 Pain in left shoulder: Secondary | ICD-10-CM | POA: Diagnosis not present

## 2020-08-27 DIAGNOSIS — M25612 Stiffness of left shoulder, not elsewhere classified: Secondary | ICD-10-CM | POA: Diagnosis not present

## 2020-08-31 DIAGNOSIS — M7502 Adhesive capsulitis of left shoulder: Secondary | ICD-10-CM | POA: Diagnosis not present

## 2020-08-31 DIAGNOSIS — M25612 Stiffness of left shoulder, not elsewhere classified: Secondary | ICD-10-CM | POA: Diagnosis not present

## 2020-08-31 DIAGNOSIS — M25512 Pain in left shoulder: Secondary | ICD-10-CM | POA: Diagnosis not present

## 2020-09-02 DIAGNOSIS — M7502 Adhesive capsulitis of left shoulder: Secondary | ICD-10-CM | POA: Diagnosis not present

## 2020-09-02 DIAGNOSIS — M25512 Pain in left shoulder: Secondary | ICD-10-CM | POA: Diagnosis not present

## 2020-09-02 DIAGNOSIS — M25612 Stiffness of left shoulder, not elsewhere classified: Secondary | ICD-10-CM | POA: Diagnosis not present

## 2020-09-06 ENCOUNTER — Other Ambulatory Visit: Payer: Self-pay

## 2020-09-06 ENCOUNTER — Ambulatory Visit: Payer: BC Managed Care – PPO | Admitting: Sports Medicine

## 2020-09-06 DIAGNOSIS — R002 Palpitations: Secondary | ICD-10-CM | POA: Diagnosis not present

## 2020-09-06 DIAGNOSIS — M19012 Primary osteoarthritis, left shoulder: Secondary | ICD-10-CM

## 2020-09-06 NOTE — Assessment & Plan Note (Signed)
Confirmed on x-rays, did have a shoulder dislocation 15 years ago, ended up with rotator cuff tear and a labral tear. Repaired arthroscopically. We injected his glenohumeral joint July 13, 2020 and he has started physical therapy, pain is 50% better, motion not much better. He will likely not get much improvement in his motion, he can contact me after another 6 weeks of physical therapy and we can certainly try another injection but no more than for a year, I did inform him that ultimately a shoulder replacement would be in his future.

## 2020-09-06 NOTE — Assessment & Plan Note (Signed)
This is a very pleasant 55 year old male, he has been having some increasing palpitations, this was worked up well by Dr. Judi Cong with a Holter monitor that showed some PVCs, he also had an echocardiogram showing a structurally normal heart. Regular rate and rhythm here in the exam room today with normal auscultation. We can certainly block this with metoprolol and he will think about it and let me know. I did inform him that these were benign.

## 2020-09-06 NOTE — Progress Notes (Signed)
    Procedures performed today:    None.  Independent interpretation of notes and tests performed by another provider:   None.  Brief History, Exam, Impression, and Recommendations:    Palpitations This is a very pleasant 54 year old male, he has been having some increasing palpitations, this was worked up well by Dr. Judi Cong with a Holter monitor that showed some PVCs, he also had an echocardiogram showing a structurally normal heart. Regular rate and rhythm here in the exam room today with normal auscultation. We can certainly block this with metoprolol and he will think about it and let me know. I did inform him that these were benign.  Primary osteoarthritis of left shoulder Confirmed on x-rays, did have a shoulder dislocation 15 years ago, ended up with rotator cuff tear and a labral tear. Repaired arthroscopically. We injected his glenohumeral joint July 13, 2020 and he has started physical therapy, pain is 50% better, motion not much better. He will likely not get much improvement in his motion, he can contact me after another 6 weeks of physical therapy and we can certainly try another injection but no more than for a year, I did inform him that ultimately a shoulder replacement would be in his future.    ___________________________________________ Ihor Austin. Benjamin Stain, M.D., ABFM., CAQSM. Primary Care and Sports Medicine [facility]  Adjunct Instructor of Family Medicine  [employer]

## 2020-09-07 DIAGNOSIS — M25512 Pain in left shoulder: Secondary | ICD-10-CM | POA: Diagnosis not present

## 2020-09-07 DIAGNOSIS — M25612 Stiffness of left shoulder, not elsewhere classified: Secondary | ICD-10-CM | POA: Diagnosis not present

## 2020-09-07 DIAGNOSIS — M7502 Adhesive capsulitis of left shoulder: Secondary | ICD-10-CM | POA: Diagnosis not present

## 2020-09-20 DIAGNOSIS — M25612 Stiffness of left shoulder, not elsewhere classified: Secondary | ICD-10-CM | POA: Diagnosis not present

## 2020-09-20 DIAGNOSIS — M25512 Pain in left shoulder: Secondary | ICD-10-CM | POA: Diagnosis not present

## 2020-09-20 DIAGNOSIS — M7502 Adhesive capsulitis of left shoulder: Secondary | ICD-10-CM | POA: Diagnosis not present

## 2020-09-23 DIAGNOSIS — M7502 Adhesive capsulitis of left shoulder: Secondary | ICD-10-CM | POA: Diagnosis not present

## 2020-09-23 DIAGNOSIS — M25512 Pain in left shoulder: Secondary | ICD-10-CM | POA: Diagnosis not present

## 2020-09-23 DIAGNOSIS — M25612 Stiffness of left shoulder, not elsewhere classified: Secondary | ICD-10-CM | POA: Diagnosis not present

## 2020-09-25 ENCOUNTER — Other Ambulatory Visit: Payer: Self-pay | Admitting: Sports Medicine

## 2020-09-25 DIAGNOSIS — I10 Essential (primary) hypertension: Secondary | ICD-10-CM

## 2020-09-27 DIAGNOSIS — M7502 Adhesive capsulitis of left shoulder: Secondary | ICD-10-CM | POA: Diagnosis not present

## 2020-09-27 DIAGNOSIS — M25612 Stiffness of left shoulder, not elsewhere classified: Secondary | ICD-10-CM | POA: Diagnosis not present

## 2020-09-27 DIAGNOSIS — M25512 Pain in left shoulder: Secondary | ICD-10-CM | POA: Diagnosis not present

## 2020-09-29 ENCOUNTER — Other Ambulatory Visit: Payer: Self-pay | Admitting: Sports Medicine

## 2020-09-29 DIAGNOSIS — M25612 Stiffness of left shoulder, not elsewhere classified: Secondary | ICD-10-CM | POA: Diagnosis not present

## 2020-09-29 DIAGNOSIS — R7989 Other specified abnormal findings of blood chemistry: Secondary | ICD-10-CM

## 2020-09-29 DIAGNOSIS — J45909 Unspecified asthma, uncomplicated: Secondary | ICD-10-CM

## 2020-09-29 DIAGNOSIS — E291 Testicular hypofunction: Secondary | ICD-10-CM

## 2020-09-29 DIAGNOSIS — M7502 Adhesive capsulitis of left shoulder: Secondary | ICD-10-CM | POA: Diagnosis not present

## 2020-09-29 DIAGNOSIS — E781 Pure hyperglyceridemia: Secondary | ICD-10-CM

## 2020-09-29 DIAGNOSIS — I10 Essential (primary) hypertension: Secondary | ICD-10-CM

## 2020-09-29 DIAGNOSIS — M25512 Pain in left shoulder: Secondary | ICD-10-CM | POA: Diagnosis not present

## 2020-09-29 MED ORDER — CLOMIPHENE CITRATE 50 MG PO TABS: 50.0000 mg | ORAL_TABLET | Freq: Every day | ORAL | 3 refills | Status: DC

## 2020-10-01 MED ORDER — FENOFIBRATE 160 MG PO TABS: 160.0000 mg | ORAL_TABLET | Freq: Every day | ORAL | 0 refills | Status: DC

## 2020-10-01 MED ORDER — FEXOFENADINE HCL 180 MG PO TABS: 180.0000 mg | ORAL_TABLET | Freq: Every day | ORAL | 0 refills | Status: DC

## 2020-10-01 MED ORDER — LEVOTHYROXINE SODIUM 50 MCG PO TABS: 50.0000 ug | ORAL_TABLET | Freq: Every day | ORAL | 0 refills | Status: DC

## 2020-10-01 MED ORDER — LISINOPRIL 10 MG PO TABS: 10.0000 mg | ORAL_TABLET | Freq: Every day | ORAL | 0 refills | Status: DC

## 2020-10-01 MED ORDER — CLOMIPHENE CITRATE 50 MG PO TABS: 50.0000 mg | ORAL_TABLET | Freq: Every day | ORAL | 3 refills | Status: DC

## 2020-10-01 NOTE — Telephone Encounter (Signed)
Thank you for teeing up and signing these refills.

## 2020-10-06 DIAGNOSIS — M7502 Adhesive capsulitis of left shoulder: Secondary | ICD-10-CM | POA: Diagnosis not present

## 2020-10-06 DIAGNOSIS — M25512 Pain in left shoulder: Secondary | ICD-10-CM | POA: Diagnosis not present

## 2020-10-06 DIAGNOSIS — M25612 Stiffness of left shoulder, not elsewhere classified: Secondary | ICD-10-CM | POA: Diagnosis not present

## 2020-10-11 DIAGNOSIS — M25512 Pain in left shoulder: Secondary | ICD-10-CM | POA: Diagnosis not present

## 2020-10-11 DIAGNOSIS — M25612 Stiffness of left shoulder, not elsewhere classified: Secondary | ICD-10-CM | POA: Diagnosis not present

## 2020-10-11 DIAGNOSIS — M7502 Adhesive capsulitis of left shoulder: Secondary | ICD-10-CM | POA: Diagnosis not present

## 2020-10-13 DIAGNOSIS — M25512 Pain in left shoulder: Secondary | ICD-10-CM | POA: Diagnosis not present

## 2020-10-13 DIAGNOSIS — M7502 Adhesive capsulitis of left shoulder: Secondary | ICD-10-CM | POA: Diagnosis not present

## 2020-10-13 DIAGNOSIS — M25612 Stiffness of left shoulder, not elsewhere classified: Secondary | ICD-10-CM | POA: Diagnosis not present

## 2020-10-18 DIAGNOSIS — M25512 Pain in left shoulder: Secondary | ICD-10-CM | POA: Diagnosis not present

## 2020-10-18 DIAGNOSIS — M7502 Adhesive capsulitis of left shoulder: Secondary | ICD-10-CM | POA: Diagnosis not present

## 2020-10-18 DIAGNOSIS — M25612 Stiffness of left shoulder, not elsewhere classified: Secondary | ICD-10-CM | POA: Diagnosis not present

## 2020-10-20 DIAGNOSIS — M7502 Adhesive capsulitis of left shoulder: Secondary | ICD-10-CM | POA: Diagnosis not present

## 2020-10-20 DIAGNOSIS — M25612 Stiffness of left shoulder, not elsewhere classified: Secondary | ICD-10-CM | POA: Diagnosis not present

## 2020-10-20 DIAGNOSIS — M25512 Pain in left shoulder: Secondary | ICD-10-CM | POA: Diagnosis not present

## 2020-10-25 DIAGNOSIS — M25612 Stiffness of left shoulder, not elsewhere classified: Secondary | ICD-10-CM | POA: Diagnosis not present

## 2020-10-25 DIAGNOSIS — M7502 Adhesive capsulitis of left shoulder: Secondary | ICD-10-CM | POA: Diagnosis not present

## 2020-10-25 DIAGNOSIS — M25512 Pain in left shoulder: Secondary | ICD-10-CM | POA: Diagnosis not present

## 2020-10-27 DIAGNOSIS — M7502 Adhesive capsulitis of left shoulder: Secondary | ICD-10-CM | POA: Diagnosis not present

## 2020-10-27 DIAGNOSIS — M25512 Pain in left shoulder: Secondary | ICD-10-CM | POA: Diagnosis not present

## 2020-10-27 DIAGNOSIS — M25612 Stiffness of left shoulder, not elsewhere classified: Secondary | ICD-10-CM | POA: Diagnosis not present

## 2020-11-01 DIAGNOSIS — M25512 Pain in left shoulder: Secondary | ICD-10-CM | POA: Diagnosis not present

## 2020-11-01 DIAGNOSIS — M25612 Stiffness of left shoulder, not elsewhere classified: Secondary | ICD-10-CM | POA: Diagnosis not present

## 2020-11-01 DIAGNOSIS — M7502 Adhesive capsulitis of left shoulder: Secondary | ICD-10-CM | POA: Diagnosis not present

## 2020-11-03 DIAGNOSIS — M25612 Stiffness of left shoulder, not elsewhere classified: Secondary | ICD-10-CM | POA: Diagnosis not present

## 2020-11-03 DIAGNOSIS — M7502 Adhesive capsulitis of left shoulder: Secondary | ICD-10-CM | POA: Diagnosis not present

## 2020-11-03 DIAGNOSIS — M25512 Pain in left shoulder: Secondary | ICD-10-CM | POA: Diagnosis not present

## 2020-11-08 DIAGNOSIS — M25512 Pain in left shoulder: Secondary | ICD-10-CM | POA: Diagnosis not present

## 2020-11-08 DIAGNOSIS — M25612 Stiffness of left shoulder, not elsewhere classified: Secondary | ICD-10-CM | POA: Diagnosis not present

## 2020-11-08 DIAGNOSIS — M7502 Adhesive capsulitis of left shoulder: Secondary | ICD-10-CM | POA: Diagnosis not present

## 2020-11-10 DIAGNOSIS — M25612 Stiffness of left shoulder, not elsewhere classified: Secondary | ICD-10-CM | POA: Diagnosis not present

## 2020-11-10 DIAGNOSIS — M7502 Adhesive capsulitis of left shoulder: Secondary | ICD-10-CM | POA: Diagnosis not present

## 2020-11-10 DIAGNOSIS — M25512 Pain in left shoulder: Secondary | ICD-10-CM | POA: Diagnosis not present

## 2020-11-11 ENCOUNTER — Other Ambulatory Visit: Payer: Self-pay

## 2020-11-11 DIAGNOSIS — E781 Pure hyperglyceridemia: Secondary | ICD-10-CM

## 2020-11-11 NOTE — Telephone Encounter (Signed)
Patient's insurance is requiring that he switch to home delivery.

## 2020-11-12 MED ORDER — FENOFIBRATE 160 MG PO TABS: 160.0000 mg | ORAL_TABLET | Freq: Every day | ORAL | 0 refills | Status: DC

## 2020-11-15 DIAGNOSIS — M25612 Stiffness of left shoulder, not elsewhere classified: Secondary | ICD-10-CM | POA: Diagnosis not present

## 2020-11-15 DIAGNOSIS — M25512 Pain in left shoulder: Secondary | ICD-10-CM | POA: Diagnosis not present

## 2020-11-15 DIAGNOSIS — M7502 Adhesive capsulitis of left shoulder: Secondary | ICD-10-CM | POA: Diagnosis not present

## 2020-11-17 DIAGNOSIS — M25512 Pain in left shoulder: Secondary | ICD-10-CM | POA: Diagnosis not present

## 2020-11-17 DIAGNOSIS — M7502 Adhesive capsulitis of left shoulder: Secondary | ICD-10-CM | POA: Diagnosis not present

## 2020-11-17 DIAGNOSIS — M25612 Stiffness of left shoulder, not elsewhere classified: Secondary | ICD-10-CM | POA: Diagnosis not present

## 2020-11-24 DIAGNOSIS — M25612 Stiffness of left shoulder, not elsewhere classified: Secondary | ICD-10-CM | POA: Diagnosis not present

## 2020-11-24 DIAGNOSIS — M25512 Pain in left shoulder: Secondary | ICD-10-CM | POA: Diagnosis not present

## 2020-11-24 DIAGNOSIS — M7502 Adhesive capsulitis of left shoulder: Secondary | ICD-10-CM | POA: Diagnosis not present

## 2020-12-01 DIAGNOSIS — M25612 Stiffness of left shoulder, not elsewhere classified: Secondary | ICD-10-CM | POA: Diagnosis not present

## 2020-12-01 DIAGNOSIS — M7502 Adhesive capsulitis of left shoulder: Secondary | ICD-10-CM | POA: Diagnosis not present

## 2020-12-01 DIAGNOSIS — M25512 Pain in left shoulder: Secondary | ICD-10-CM | POA: Diagnosis not present

## 2020-12-08 DIAGNOSIS — M25612 Stiffness of left shoulder, not elsewhere classified: Secondary | ICD-10-CM | POA: Diagnosis not present

## 2020-12-08 DIAGNOSIS — M25512 Pain in left shoulder: Secondary | ICD-10-CM | POA: Diagnosis not present

## 2020-12-08 DIAGNOSIS — M7502 Adhesive capsulitis of left shoulder: Secondary | ICD-10-CM | POA: Diagnosis not present

## 2020-12-13 DIAGNOSIS — M19012 Primary osteoarthritis, left shoulder: Secondary | ICD-10-CM | POA: Diagnosis not present

## 2020-12-15 DIAGNOSIS — M25512 Pain in left shoulder: Secondary | ICD-10-CM | POA: Diagnosis not present

## 2020-12-15 DIAGNOSIS — M25612 Stiffness of left shoulder, not elsewhere classified: Secondary | ICD-10-CM | POA: Diagnosis not present

## 2020-12-15 DIAGNOSIS — M7502 Adhesive capsulitis of left shoulder: Secondary | ICD-10-CM | POA: Diagnosis not present

## 2021-01-01 ENCOUNTER — Other Ambulatory Visit: Payer: Self-pay | Admitting: Sports Medicine

## 2021-01-01 DIAGNOSIS — J45909 Unspecified asthma, uncomplicated: Secondary | ICD-10-CM

## 2021-01-01 DIAGNOSIS — R7989 Other specified abnormal findings of blood chemistry: Secondary | ICD-10-CM

## 2021-01-01 DIAGNOSIS — E781 Pure hyperglyceridemia: Secondary | ICD-10-CM

## 2021-01-03 ENCOUNTER — Other Ambulatory Visit: Payer: Self-pay

## 2021-01-03 DIAGNOSIS — I10 Essential (primary) hypertension: Secondary | ICD-10-CM

## 2021-01-03 DIAGNOSIS — R7989 Other specified abnormal findings of blood chemistry: Secondary | ICD-10-CM

## 2021-01-05 ENCOUNTER — Other Ambulatory Visit: Payer: Self-pay

## 2021-01-05 DIAGNOSIS — I10 Essential (primary) hypertension: Secondary | ICD-10-CM

## 2021-01-06 MED ORDER — LISINOPRIL 10 MG PO TABS: 10.0000 mg | ORAL_TABLET | Freq: Every day | ORAL | 0 refills | Status: DC

## 2021-01-19 DIAGNOSIS — R9431 Abnormal electrocardiogram [ECG] [EKG]: Secondary | ICD-10-CM | POA: Diagnosis not present

## 2021-01-19 DIAGNOSIS — R002 Palpitations: Secondary | ICD-10-CM | POA: Diagnosis not present

## 2021-01-19 DIAGNOSIS — I10 Essential (primary) hypertension: Secondary | ICD-10-CM | POA: Diagnosis not present

## 2021-01-23 ENCOUNTER — Other Ambulatory Visit: Payer: Self-pay | Admitting: Sports Medicine

## 2021-01-23 DIAGNOSIS — E291 Testicular hypofunction: Secondary | ICD-10-CM

## 2021-02-07 DIAGNOSIS — R9431 Abnormal electrocardiogram [ECG] [EKG]: Secondary | ICD-10-CM | POA: Diagnosis not present

## 2021-02-07 DIAGNOSIS — I10 Essential (primary) hypertension: Secondary | ICD-10-CM | POA: Diagnosis not present

## 2021-02-07 DIAGNOSIS — R002 Palpitations: Secondary | ICD-10-CM | POA: Diagnosis not present

## 2021-03-14 DIAGNOSIS — I10 Essential (primary) hypertension: Secondary | ICD-10-CM | POA: Diagnosis not present

## 2021-04-03 ENCOUNTER — Other Ambulatory Visit: Payer: Self-pay | Admitting: Sports Medicine

## 2021-04-03 DIAGNOSIS — I10 Essential (primary) hypertension: Secondary | ICD-10-CM

## 2021-05-13 ENCOUNTER — Other Ambulatory Visit: Payer: Self-pay | Admitting: Sports Medicine

## 2021-05-13 DIAGNOSIS — E291 Testicular hypofunction: Secondary | ICD-10-CM

## 2021-05-13 MED ORDER — CLOMIPHENE CITRATE 50 MG PO TABS: 50.0000 mg | ORAL_TABLET | Freq: Every day | ORAL | 3 refills | Status: DC

## 2021-06-07 DIAGNOSIS — B360 Pityriasis versicolor: Secondary | ICD-10-CM

## 2021-06-08 DIAGNOSIS — B360 Pityriasis versicolor: Secondary | ICD-10-CM | POA: Insufficient documentation

## 2021-06-08 MED ORDER — FLUCONAZOLE 150 MG PO TABS: ORAL_TABLET | ORAL | 0 refills | Status: DC

## 2021-06-08 NOTE — Telephone Encounter (Signed)
I spent 5 total minutes of online digital evaluation and management services. 

## 2021-06-08 NOTE — Assessment & Plan Note (Addendum)
Likely pruritic rash on the back, pictures attached, appears to be tinea versicolor, has tried some over-the-counter topical antifungals without improvement, adding high-dose Diflucan 300 mg weekly x2.

## 2021-07-08 DIAGNOSIS — M19012 Primary osteoarthritis, left shoulder: Secondary | ICD-10-CM | POA: Diagnosis not present

## 2021-07-08 DIAGNOSIS — M25512 Pain in left shoulder: Secondary | ICD-10-CM | POA: Diagnosis not present

## 2021-07-13 ENCOUNTER — Ambulatory Visit (INDEPENDENT_AMBULATORY_CARE_PROVIDER_SITE_OTHER): Payer: BC Managed Care – PPO | Admitting: Sports Medicine

## 2021-07-13 ENCOUNTER — Other Ambulatory Visit: Payer: Self-pay

## 2021-07-13 VITALS — BP 132/75 | HR 79 | Ht 72.0 in | Wt 216.0 lb

## 2021-07-13 DIAGNOSIS — E291 Testicular hypofunction: Secondary | ICD-10-CM

## 2021-07-13 DIAGNOSIS — I10 Essential (primary) hypertension: Secondary | ICD-10-CM

## 2021-07-13 DIAGNOSIS — B360 Pityriasis versicolor: Secondary | ICD-10-CM

## 2021-07-13 DIAGNOSIS — N411 Chronic prostatitis: Secondary | ICD-10-CM | POA: Diagnosis not present

## 2021-07-13 DIAGNOSIS — R7989 Other specified abnormal findings of blood chemistry: Secondary | ICD-10-CM | POA: Diagnosis not present

## 2021-07-13 DIAGNOSIS — Z0000 Encounter for general adult medical examination without abnormal findings: Secondary | ICD-10-CM

## 2021-07-13 MED ORDER — SHINGRIX 50 MCG/0.5ML IM SUSR: 0.5000 mL | Freq: Once | INTRAMUSCULAR | 0 refills | Status: AC

## 2021-07-13 MED ORDER — TADALAFIL 5 MG PO TABS: 5.0000 mg | ORAL_TABLET | Freq: Every day | ORAL | 11 refills | Status: DC

## 2021-07-13 MED ORDER — LEVOTHYROXINE SODIUM 50 MCG PO TABS: 50.0000 ug | ORAL_TABLET | Freq: Every day | ORAL | 3 refills | Status: DC

## 2021-07-13 NOTE — Progress Notes (Signed)
Subjective:    CC: Annual Physical Exam  HPI:  This patient is here for their annual physical  I reviewed the past medical history, family history, social history, surgical history, and allergies today and no changes were needed.  Please see the problem list section below in epic for further details.  Past Medical History: Past Medical History:  Diagnosis Date   Chronic prostatitis 08/04/2014   Hypertriglyceridemia 08/04/2014   Hypogonadism in male 08/04/2014   Hypothyroidism 08/04/2014   Past Surgical History: Past Surgical History:  Procedure Laterality Date   APPENDECTOMY     SHOULDER ARTHROSCOPY     TENDON REPAIR     Social History: Social History   Socioeconomic History   Marital status: Married    Spouse name: Not on file   Number of children: Not on file   Years of education: Not on file   Highest education level: Not on file  Occupational History   Not on file  Tobacco Use   Smoking status: Never   Smokeless tobacco: Never  Substance and Sexual Activity   Alcohol use: Not on file   Drug use: Not on file   Sexual activity: Not on file  Other Topics Concern   Not on file  Social History Narrative   Not on file   Social Determinants of Health   Financial Resource Strain: Not on file  Food Insecurity: Not on file  Transportation Needs: Not on file  Physical Activity: Not on file  Stress: Not on file  Social Connections: Not on file   Family History: Family History  Problem Relation Age of Onset   Other Neg Hx        hypogonadism   Allergies: Allergies  Allergen Reactions   Augmentin [Amoxicillin-Pot Clavulanate]     rash   Ciprofloxacin Other (See Comments)    Body aches   Levofloxacin     Tendon Rupture   Meperidine     Blood pressure drop   Meperidine Hcl    Medications: See med rec.  Review of Systems: No headache, visual changes, nausea, vomiting, diarrhea, constipation, dizziness, abdominal pain, skin rash, fevers, chills, night  sweats, swollen lymph nodes, weight loss, chest pain, body aches, joint swelling, muscle aches, shortness of breath, mood changes, visual or auditory hallucinations.  Objective:    General: Well Developed, well nourished, and in no acute distress.  Neuro: Alert and oriented x3, extra-ocular muscles intact, sensation grossly intact. Cranial nerves II through XII are intact, motor, sensory, and coordinative functions are all intact. HEENT: Normocephalic, atraumatic, pupils equal round reactive to light, neck supple, no masses, no lymphadenopathy, thyroid nonpalpable. Oropharynx, nasopharynx, external ear canals are unremarkable. Skin: Warm and dry, no rashes noted.  Cardiac: Regular rate and rhythm, no murmurs rubs or gallops.  Respiratory: Clear to auscultation bilaterally. Not using accessory muscles, speaking in full sentences.  Abdominal: Soft, nontender, nondistended, positive bowel sounds, no masses, no organomegaly.  Musculoskeletal: Shoulder, elbow, wrist, hip, knee, ankle stable, and with full range of motion.  Impression and Recommendations:    The patient was counselled, risk factors were discussed, anticipatory guidance given.  Annual physical exam Annual physical as above, checking routine labs. Shingrix sent to pharmacy.  Essential hypertension Controlled, no change in plan, continue current medications.  Secondary male hypogonadism Continue Clomid, rechecking testosterone. Sound like he does have some difficulty with sexual desire, on further questioning he does have difficulty achieving a quality erection, adding Cialis generic. We will send this to [facility]  with a good Rx coupon.  Tinea versicolor Resolved after Diflucan treatment.   ___________________________________________ Ihor Austin. Benjamin Stain, M.D., ABFM., CAQSM. Primary Care and Sports Medicine [facility]  Adjunct Professor of Family Medicine  [employer]

## 2021-07-13 NOTE — Assessment & Plan Note (Signed)
Controlled, no change in plan, continue current medications.

## 2021-07-13 NOTE — Assessment & Plan Note (Signed)
Continue Clomid, rechecking testosterone. Sound like he does have some difficulty with sexual desire, on further questioning he does have difficulty achieving a quality erection, adding Cialis generic. We will send this to [facility] with a good Rx coupon.

## 2021-07-13 NOTE — Assessment & Plan Note (Addendum)
Annual physical as above, checking routine labs. Shingrix sent to pharmacy.

## 2021-07-13 NOTE — Assessment & Plan Note (Signed)
Resolved after Diflucan treatment.

## 2021-07-16 LAB — CBC
HCT: 43.2 % (ref 38.5–50.0)
Hemoglobin: 15 g/dL (ref 13.2–17.1)
MCH: 32.6 pg (ref 27.0–33.0)
MCHC: 34.7 g/dL (ref 32.0–36.0)
MCV: 93.9 fL (ref 80.0–100.0)
MPV: 9.9 fL (ref 7.5–12.5)
Platelets: 230 10*3/uL (ref 140–400)
RBC: 4.6 10*6/uL (ref 4.20–5.80)
RDW: 12.5 % (ref 11.0–15.0)
WBC: 4.4 10*3/uL (ref 3.8–10.8)

## 2021-07-16 LAB — COMPREHENSIVE METABOLIC PANEL
AG Ratio: 2.1 (calc) (ref 1.0–2.5)
ALT: 16 U/L (ref 9–46)
AST: 16 U/L (ref 10–35)
Albumin: 4.7 g/dL (ref 3.6–5.1)
Alkaline phosphatase (APISO): 37 U/L (ref 35–144)
BUN: 19 mg/dL (ref 7–25)
CO2: 27 mmol/L (ref 20–32)
Calcium: 9.7 mg/dL (ref 8.6–10.3)
Chloride: 106 mmol/L (ref 98–110)
Creat: 1 mg/dL (ref 0.70–1.30)
Globulin: 2.2 g/dL (calc) (ref 1.9–3.7)
Glucose, Bld: 85 mg/dL (ref 65–99)
Potassium: 4.1 mmol/L (ref 3.5–5.3)
Sodium: 145 mmol/L (ref 135–146)
Total Bilirubin: 0.5 mg/dL (ref 0.2–1.2)
Total Protein: 6.9 g/dL (ref 6.1–8.1)

## 2021-07-16 LAB — LIPID PANEL
Cholesterol: 186 mg/dL (ref <200)
HDL: 46 mg/dL (ref >=40)
LDL Cholesterol (Calc): 122 mg/dL (calc) — ABNORMAL HIGH
Non-HDL Cholesterol (Calc): 140 mg/dL (calc) — ABNORMAL HIGH (ref <130)
Total CHOL/HDL Ratio: 4 (calc) (ref <5.0)
Triglycerides: 82 mg/dL (ref <150)

## 2021-07-16 LAB — PSA, TOTAL AND FREE
PSA, % Free: 27 % (calc) (ref >25)
PSA, Free: 0.8 ng/mL
PSA, Total: 3 ng/mL (ref <=4.0)

## 2021-07-16 LAB — TSH: TSH: 3.74 mIU/L (ref 0.40–4.50)

## 2021-07-16 LAB — TESTOSTERONE, FREE & TOTAL
Free Testosterone: 71.5 pg/mL (ref 35.0–155.0)
Testosterone, Total, LC-MS-MS: 535 ng/dL (ref 250–1100)

## 2021-08-26 ENCOUNTER — Other Ambulatory Visit: Payer: Self-pay

## 2021-08-26 DIAGNOSIS — E781 Pure hyperglyceridemia: Secondary | ICD-10-CM

## 2021-08-26 DIAGNOSIS — R7989 Other specified abnormal findings of blood chemistry: Secondary | ICD-10-CM

## 2021-08-26 MED ORDER — FENOFIBRATE 160 MG PO TABS: 160.0000 mg | ORAL_TABLET | Freq: Every day | ORAL | 3 refills | Status: DC

## 2021-08-26 MED ORDER — LEVOTHYROXINE SODIUM 50 MCG PO TABS: 50.0000 ug | ORAL_TABLET | Freq: Every day | ORAL | 3 refills | Status: DC

## 2021-09-05 DIAGNOSIS — R9431 Abnormal electrocardiogram [ECG] [EKG]: Secondary | ICD-10-CM | POA: Diagnosis not present

## 2021-09-05 DIAGNOSIS — I10 Essential (primary) hypertension: Secondary | ICD-10-CM | POA: Diagnosis not present

## 2021-09-21 ENCOUNTER — Other Ambulatory Visit: Payer: Self-pay

## 2021-09-21 ENCOUNTER — Ambulatory Visit: Payer: BC Managed Care – PPO | Admitting: Sports Medicine

## 2021-09-21 ENCOUNTER — Ambulatory Visit (INDEPENDENT_AMBULATORY_CARE_PROVIDER_SITE_OTHER): Payer: BC Managed Care – PPO

## 2021-09-21 DIAGNOSIS — R002 Palpitations: Secondary | ICD-10-CM

## 2021-09-21 NOTE — Progress Notes (Addendum)
° ° °  Procedures performed today:    None.  Independent interpretation of notes and tests performed by another provider:   None.  Brief History, Exam, Impression, and Recommendations:    Palpitations Casey Elliott returns, he is a pleasant 56 year old male, increasing palpitations, we discussed this back in December 2021 this was worked up in the past with a Holter monitor that showed some PVCs, ECG showed a structurally normal heart, he has also had a normal stress echo. Today he was regular rate and rhythm in the exam room. Normal auscultation. We we will pull the trigger for another long-term event monitor. Labs were normal. I do suspect PACs and PVCs, happy to add metoprolol. I have also encouraged him to play more hockey and exercise more which can decrease the sensations  Update: Zio patch showed predominantly isolated ventricular ectopy with otherwise normal sinus rhythm.  In the setting of a structurally normal heart I have recommended the options of observation or beta-blocker, I do not think he is agreeable with this option and would like a second opinion from cardiology, happy to oblige, referral placed.   ___________________________________________ Ihor Austin. Benjamin Stain, M.D., ABFM., CAQSM. Primary Care and Sports Medicine [facility]  Adjunct Instructor of Family Medicine  [employer]

## 2021-09-21 NOTE — Assessment & Plan Note (Addendum)
Casey Elliott returns, he is a pleasant 56 year old male, increasing palpitations, we discussed this back in December 2021 this was worked up in the past with a Holter monitor that showed some PVCs, ECG showed a structurally normal heart, he has also had a normal stress echo. Today he was regular rate and rhythm in the exam room. Normal auscultation. We we will pull the trigger for another long-term event monitor. Labs were normal. I do suspect PACs and PVCs, happy to add metoprolol. I have also encouraged him to play more hockey and exercise more which can decrease the sensations  Update: Zio patch showed predominantly isolated ventricular ectopy with otherwise normal sinus rhythm.  In the setting of a structurally normal heart I have recommended the options of observation or beta-blocker, I do not think he is agreeable with this option and would like a second opinion from cardiology, happy to oblige, referral placed.

## 2021-09-21 NOTE — Progress Notes (Unsigned)
Enrolled patient for a 7 day Zio XT monitor to be mailed to patients home.  

## 2021-09-25 DIAGNOSIS — R002 Palpitations: Secondary | ICD-10-CM

## 2021-09-30 ENCOUNTER — Encounter: Payer: Self-pay | Admitting: Sports Medicine

## 2021-09-30 DIAGNOSIS — U071 COVID-19: Secondary | ICD-10-CM | POA: Insufficient documentation

## 2021-09-30 MED ORDER — NIRMATRELVIR/RITONAVIR (PAXLOVID)TABLET: ORAL_TABLET | ORAL | 0 refills | Status: DC

## 2021-10-12 NOTE — Addendum Note (Signed)
Addended by: Monica Becton on: 10/12/2021 10:27 AM   Modules accepted: Orders, Level of Service

## 2021-10-23 DIAGNOSIS — Z885 Allergy status to narcotic agent status: Secondary | ICD-10-CM | POA: Diagnosis not present

## 2021-10-23 DIAGNOSIS — Z880 Allergy status to penicillin: Secondary | ICD-10-CM | POA: Diagnosis not present

## 2021-10-23 DIAGNOSIS — I10 Essential (primary) hypertension: Secondary | ICD-10-CM | POA: Diagnosis not present

## 2021-10-23 DIAGNOSIS — R002 Palpitations: Secondary | ICD-10-CM | POA: Diagnosis not present

## 2021-10-23 DIAGNOSIS — J80 Acute respiratory distress syndrome: Secondary | ICD-10-CM | POA: Diagnosis not present

## 2021-10-23 DIAGNOSIS — F419 Anxiety disorder, unspecified: Secondary | ICD-10-CM | POA: Diagnosis not present

## 2021-10-23 DIAGNOSIS — Z888 Allergy status to other drugs, medicaments and biological substances status: Secondary | ICD-10-CM | POA: Diagnosis not present

## 2021-10-23 DIAGNOSIS — Z79899 Other long term (current) drug therapy: Secondary | ICD-10-CM | POA: Diagnosis not present

## 2021-10-23 DIAGNOSIS — R450 Nervousness: Secondary | ICD-10-CM | POA: Diagnosis not present

## 2021-10-23 DIAGNOSIS — R42 Dizziness and giddiness: Secondary | ICD-10-CM | POA: Diagnosis not present

## 2021-10-23 DIAGNOSIS — Z883 Allergy status to other anti-infective agents status: Secondary | ICD-10-CM | POA: Diagnosis not present

## 2021-10-23 DIAGNOSIS — R110 Nausea: Secondary | ICD-10-CM | POA: Diagnosis not present

## 2021-10-23 DIAGNOSIS — R079 Chest pain, unspecified: Secondary | ICD-10-CM | POA: Diagnosis not present

## 2021-10-23 DIAGNOSIS — R000 Tachycardia, unspecified: Secondary | ICD-10-CM | POA: Diagnosis not present

## 2021-10-23 DIAGNOSIS — E876 Hypokalemia: Secondary | ICD-10-CM | POA: Diagnosis not present

## 2021-10-23 DIAGNOSIS — R202 Paresthesia of skin: Secondary | ICD-10-CM | POA: Diagnosis not present

## 2021-10-23 DIAGNOSIS — R0789 Other chest pain: Secondary | ICD-10-CM | POA: Diagnosis not present

## 2021-11-01 ENCOUNTER — Other Ambulatory Visit: Payer: Self-pay

## 2021-11-01 ENCOUNTER — Encounter: Payer: Self-pay | Admitting: Sports Medicine

## 2021-11-01 ENCOUNTER — Ambulatory Visit: Payer: BC Managed Care – PPO | Admitting: Sports Medicine

## 2021-11-01 DIAGNOSIS — F5101 Primary insomnia: Secondary | ICD-10-CM

## 2021-11-01 DIAGNOSIS — R002 Palpitations: Secondary | ICD-10-CM

## 2021-11-01 MED ORDER — ALPRAZOLAM 0.5 MG PO TABS
0.2500 mg | ORAL_TABLET | Freq: Every evening | ORAL | 0 refills | Status: DC | PRN
Start: 2021-11-01 — End: 2022-02-20

## 2021-11-01 NOTE — Assessment & Plan Note (Signed)
Casey Elliott returns, he is a pleasant 56 year old male, increasing palpitations, back in 2021 he did wear a Holter monitor that showed some PACs and PVCs, ECG and stress echo in 2021 showed a structurally normal heart. He ended up in the ED, work-up was unrevealing with the exception of mild hypokalemia. We pulled the trigger for another long-term event monitor, in the meantime I encouraged him to play hockey and exercise more which can help decrease his sensations of palpitations. His Zio patch ultimately showed isolated ventricular ectopy with otherwise normal sinus rhythm. In the setting of a structurally normal heart I had recommended observation +/- beta-blocker. His potassium levels were a bit low in the ED, we will recheck that today with a CBC and TSH. I did explain to him that long-term ventricular ectopy can predispose to cardiomyopathy and that I would certainly recommend beta-blocker treatment, he does have an appointment coming up with his cardiologist tomorrow. He has been under a great deal of stress lately.

## 2021-11-01 NOTE — Assessment & Plan Note (Signed)
Casey Elliott continues to have significant anxiety and insomnia, he has done really well with alprazolam 0.5 mg, I am happy to refill this. I will give him 2 months at which point he agrees to stop.

## 2021-11-01 NOTE — Progress Notes (Signed)
° ° °  Procedures performed today:    None.  Independent interpretation of notes and tests performed by another provider:   None.  Brief History, Exam, Impression, and Recommendations:    Palpitations Casey Elliott returns, he is a pleasant 56 year old male, increasing palpitations, back in 2021 he did wear a Holter monitor that showed some PACs and PVCs, ECG and stress echo in 2021 showed a structurally normal heart. He ended up in the ED, work-up was unrevealing with the exception of mild hypokalemia. We pulled the trigger for another long-term event monitor, in the meantime I encouraged him to play hockey and exercise more which can help decrease his sensations of palpitations. His Zio patch ultimately showed isolated ventricular ectopy with otherwise normal sinus rhythm. In the setting of a structurally normal heart I had recommended observation +/- beta-blocker. His potassium levels were a bit low in the ED, we will recheck that today with a CBC and TSH. I did explain to him that long-term ventricular ectopy can predispose to cardiomyopathy and that I would certainly recommend beta-blocker treatment, he does have an appointment coming up with his cardiologist tomorrow. He has been under a great deal of stress lately.  Insomnia [name] continues to have significant anxiety and insomnia, he has done really well with alprazolam 0.5 mg, I am happy to refill this. I will give him 2 months at which point he agrees to stop.    ___________________________________________ Ihor Austin. Benjamin Stain, M.D., ABFM., CAQSM. Primary Care and Sports Medicine [facility]  Adjunct Instructor of Family Medicine  [employer]

## 2021-11-02 ENCOUNTER — Ambulatory Visit (HOSPITAL_BASED_OUTPATIENT_CLINIC_OR_DEPARTMENT_OTHER): Payer: BC Managed Care – PPO | Admitting: Cardiology

## 2021-11-02 ENCOUNTER — Encounter (HOSPITAL_BASED_OUTPATIENT_CLINIC_OR_DEPARTMENT_OTHER): Payer: Self-pay | Admitting: Cardiology

## 2021-11-02 VITALS — BP 118/72 | HR 77 | Ht 72.0 in | Wt 212.0 lb

## 2021-11-02 DIAGNOSIS — R002 Palpitations: Secondary | ICD-10-CM

## 2021-11-02 DIAGNOSIS — Z7189 Other specified counseling: Secondary | ICD-10-CM | POA: Diagnosis not present

## 2021-11-02 DIAGNOSIS — Z712 Person consulting for explanation of examination or test findings: Secondary | ICD-10-CM | POA: Diagnosis not present

## 2021-11-02 DIAGNOSIS — I493 Ventricular premature depolarization: Secondary | ICD-10-CM | POA: Diagnosis not present

## 2021-11-02 LAB — COMPREHENSIVE METABOLIC PANEL
AG Ratio: 2 (calc) (ref 1.0–2.5)
ALT: 17 U/L (ref 9–46)
AST: 14 U/L (ref 10–35)
Albumin: 4.5 g/dL (ref 3.6–5.1)
Alkaline phosphatase (APISO): 51 U/L (ref 35–144)
BUN: 14 mg/dL (ref 7–25)
CO2: 25 mmol/L (ref 20–32)
Calcium: 9.4 mg/dL (ref 8.6–10.3)
Chloride: 106 mmol/L (ref 98–110)
Creat: 0.93 mg/dL (ref 0.70–1.30)
Globulin: 2.3 g/dL (calc) (ref 1.9–3.7)
Glucose, Bld: 83 mg/dL (ref 65–139)
Potassium: 4.2 mmol/L (ref 3.5–5.3)
Sodium: 141 mmol/L (ref 135–146)
Total Bilirubin: 0.7 mg/dL (ref 0.2–1.2)
Total Protein: 6.8 g/dL (ref 6.1–8.1)

## 2021-11-02 LAB — CBC
HCT: 41.7 % (ref 38.5–50.0)
Hemoglobin: 14.3 g/dL (ref 13.2–17.1)
MCH: 32.4 pg (ref 27.0–33.0)
MCHC: 34.3 g/dL (ref 32.0–36.0)
MCV: 94.6 fL (ref 80.0–100.0)
MPV: 10.4 fL (ref 7.5–12.5)
Platelets: 194 10*3/uL (ref 140–400)
RBC: 4.41 10*6/uL (ref 4.20–5.80)
RDW: 12 % (ref 11.0–15.0)
WBC: 4.3 10*3/uL (ref 3.8–10.8)

## 2021-11-02 LAB — TSH: TSH: 2.91 mIU/L (ref 0.40–4.50)

## 2021-11-02 NOTE — Progress Notes (Signed)
Cardiology Office Note:    Date:  11/04/2021   ID:  Casey Elliott, DOB 1966-01-11, MRN [id number]  PCP:  Silverio Decamp, MD  Cardiologist:  Buford Dresser, MD  Referring MD: Silverio Decamp,*   CC: new patient consultation for palpitations  History of Present Illness:    Casey Elliott is a 56 y.o. male with a hx of hypertension and anxiety who is seen as a new consult at the request of Silverio Decamp,* for the evaluation and management of palpitations.  He saw Dr. Dianah Field 09/21/21 complaining of increasing palpitations. They discussed the results of his monitor and he was started on metoprolol. Beta-blockers were recommended but he requested a second opinion.   He was seen in the ED 10/23/21 with chest pain. He was driving with his son when he started having tingling in his hands and feet. He felt like he was going to pass out. He called emergency services. Upon arrival, the chest pain resolve but he endorsed nausea, lightheadedness, and palpitations. He reported he was not taking his metoprolol. His potassium was 2.9. He was discharged with potassium supplements.  He saw Dr. Dianah Field again 11/01/21. They discussed the use of beta-blockers but he was not interested at the time.   Today: Overall, he is doing well. Last week, when lying in bed, he felt intense palpitations. He described the severity as a pounding sensation causing his back to shake. He felt something was wrong with his body and decided to go to the hospital with his son. They were driving to [place] but decided to pull to the side of the road and contact emergency services. He reported full body tingling and throbbing and felt like he was going to pass out. He laid down on the ground until emergency services came. He almost passed out in the ambulance 4 times. At the ER, he was told he had low potassium, a severe panic attack, and hypertension of 195/115 with 107 bpm. He was discharged and  wanted a second opinion about his plan of care. He has been told he should be on beta-blockers.   The palpitations started suddenly and they have been occurring frequently ever since. He feels the palpitations primarily at night. These episodes cause him to feel stress, nausea, and the need to cough. He reports feeling the palpitations episodes back-to-back. He feels the pounding sensation from his toes to his neck and he has difficulty sleeping when he feels them at night.   He wonders if his medications are related to his sudden palpitations. He stopped taking his medications after the palpitations started. He has been slowly re-starting his medications. He has not taken his thyroid medication in about 3 weeks.   He has tried Lisinopril in the past but his evening blood pressure was elevated. He was switched to fluid pills which caused his pressure to be too low and he felt lightheaded. He denies having issues with fluid retention and swelling. Of note, he reportedly had adverse effects on amlodipine.   Last December, his mother fell, broke her rib, and passed away at 54 years old. Within the same week, his father had a possible stroke and fell and his father-in-law had a stroke. His father is still recovering and his wife is in [place] caring for his father-in-law. These incidents have caused him stress.  The stress from his life and cardiovascular health has prevented him from being active. Usually, he enjoys playing hockey. When playing hockey, he does not  notice the palpitations.  Occasionally, he feels chest pain localized in the central and lateral L areas. He describes the feeling as a gas bubble in his chest. The pain is brief and dissipates with time.   Of note, he was told he had mitral valve prolapse in the 1990s.  Denies shortness of breath at rest or with normal exertion. No PND, orthopnea, LE edema or unexpected weight gain. No syncope.  Past Medical History:  Diagnosis Date    Chronic prostatitis 08/04/2014   Hypertriglyceridemia 08/04/2014   Hypogonadism in male 08/04/2014   Hypothyroidism 08/04/2014    Past Surgical History:  Procedure Laterality Date   APPENDECTOMY     SHOULDER ARTHROSCOPY     TENDON REPAIR      Current Medications: Current Outpatient Medications on File Prior to Visit  Medication Sig   ALPRAZolam (XANAX) 0.5 MG tablet Take 0.5-1 tablets (0.25-0.5 mg total) by mouth at bedtime as needed for anxiety.   chlorthalidone (HYGROTON) 25 MG tablet Take 12.5 mg by mouth every morning.   clomiPHENE (CLOMID) 50 MG tablet Take 1 tablet (50 mg total) by mouth daily.   fenofibrate 160 MG tablet Take 1 tablet (160 mg total) by mouth daily.   fexofenadine (ALLEGRA) 180 MG tablet TAKE 1 TABLET BY MOUTH EVERY DAY   levothyroxine (SYNTHROID) 50 MCG tablet Take 1 tablet (50 mcg total) by mouth daily.   lisinopril (ZESTRIL) 10 MG tablet TAKE 1 TABLET BY MOUTH EVERY DAY   potassium chloride SA (KLOR-CON M) 20 MEQ tablet Take by mouth.   tadalafil (CIALIS) 5 MG tablet Take 1 tablet (5 mg total) by mouth daily.   No current facility-administered medications on file prior to visit.     Allergies:   Augmentin [amoxicillin-pot clavulanate], Ciprofloxacin, Levofloxacin, Meperidine, and Meperidine hcl   Social History   Tobacco Use   Smoking status: Never   Smokeless tobacco: Never    Family History: Father with possible stroke  ROS:   Please see the history of present illness.  Additional pertinent ROS: Constitutional: Negative for chills, fever, night sweats, unintentional weight loss  HENT: Negative for ear pain and hearing loss.   Eyes: Negative for loss of vision and eye pain.  Respiratory: Negative for cough, sputum, wheezing.   Cardiovascular: See HPI. Positive for palpitations and chest pain Gastrointestinal: Negative for abdominal pain, melena, and hematochezia.  Genitourinary: Negative for dysuria and hematuria.  Musculoskeletal: Negative  for falls and myalgias.  Skin: Negative for itching and rash.  Neurological: Negative for focal weakness, focal sensory changes and loss of consciousness. Positive for stress Endo/Heme/Allergies: Does not bruise/bleed easily.    EKGs/Labs/Other Studies Reviewed:    The following studies were reviewed today: Monitor 10/11/21 Patch Wear Time:  7 days and 2 hours (2023-01-08T12:02:57-0500 to 2023-01-15T14:06:49-0500)   Patient had a min HR of 45 bpm, max HR of 148 bpm, and avg HR of 86 bpm. Predominant underlying rhythm was Sinus Rhythm. Isolated SVEs were rare (<1.0%), and no SVE Couplets or SVE Triplets were present. Isolated VEs were occasional (2.0%, 17749), VE Couplets were rare (<1.0%, 12), and no VE Triplets were present. Ventricular Bigeminy and Trigeminy were present.   Echo Stress 02/07/21 (Care Everywhere) Left Ventricle: Systolic function is normal. EF: 55-60%.    Post-stress Impression: The study is negative and shows no  echocardiographic evidence of ischemia.    Post-stress Impression: This study shows a low prognostic risk.  Echo 02/03/15 - Left ventricle: The cavity size was normal. Wall  thickness was    increased in a pattern of mild LVH. Systolic function was normal.    The estimated ejection fraction was in the range of 55% to 60%.    Wall motion was normal; there were no regional wall motion    abnormalities. Left ventricular diastolic function parameters    were normal.  - Left atrium: The atrium was mildly dilated.   EKG:  EKG is personally reviewed.   11/02/21: NSR at 77 bpm. 30 second rhythm strip with 1 PVC  Recent Labs: 11/01/2021: ALT 17; BUN 14; Creat 0.93; Hemoglobin 14.3; Platelets 194; Potassium 4.2; Sodium 141; TSH 2.91  Recent Lipid Panel    Component Value Date/Time   CHOL 186 07/13/2021 0000   TRIG 82 07/13/2021 0000   HDL 46 07/13/2021 0000   CHOLHDL 4.0 07/13/2021 0000   VLDL NOT CALC 08/20/2014 0923   LDLCALC 122 (H) 07/13/2021 0000     Physical Exam:    VS:  BP 118/72    Pulse 77    Ht 6' (1.829 m)    Wt 212 lb (96.2 kg)    BMI 28.75 kg/m     Wt Readings from Last 3 Encounters:  11/02/21 212 lb (96.2 kg)  11/01/21 211 lb (95.7 kg)  09/21/21 219 lb 1.3 oz (99.4 kg)    GEN: Well nourished, well developed in no acute distress HEENT: Normal, moist mucous membranes NECK: No JVD CARDIAC: regular rhythm, normal S1 and S2, no rubs or gallops. No murmur. VASCULAR: Radial and DP pulses 2+ bilaterally. No carotid bruits RESPIRATORY:  Clear to auscultation without rales, wheezing or rhonchi  ABDOMEN: Soft, non-tender, non-distended MUSCULOSKELETAL:  Ambulates independently SKIN: Warm and dry, no edema NEUROLOGIC:  Alert and oriented x 3. No focal neuro deficits noted. PSYCHIATRIC:  Normal affect    ASSESSMENT:    1. PVC (premature ventricular contraction)   2. Palpitations   3. Cardiac risk counseling   4. Counseling on health promotion and disease prevention   5. Encounter to discuss test results    PLAN:    Palpitations Occasional PVCs Review of monitor and echo -we spent extensive time today discussing cardiac etiology for palpiations, what PVCs are, and reviewing his prior testing -he was having frequent symptoms at the time of the rhythm strip, and only one PVC noted.  -his total burden on his monitor most recently was 2.0% PVCs. Prior 24 hour holter noted only 1 PVC -echo and echo stress unremarkable -I discussed with him how the nervous system plays a crucial role in potentiating the sensation of palpitations. He does not meet the typical threshold for treatment (20% burden of PVCs), and he would like to avoid medication if possible -we discussed how to try to manage the sympathetic nervous system tone, which is likely exacerbating his sensations. He will work on this -overall he is low risk from a cardiac perspective -we did review red flag warning signs that need immediate medical attention -he will  work on making some changes, and we will discuss more in follow up  Cardiac risk counseling and prevention recommendations: we will also discuss more in follow up -recommend heart healthy/Mediterranean diet, with whole grains, fruits, vegetable, fish, lean meats, nuts, and olive oil. Limit salt. -recommend moderate walking, 3-5 times/week for 30-50 minutes each session. Aim for at least 150 minutes.week. Goal should be pace of 3 miles/hours, or walking 1.5 miles in 30 minutes -recommend avoidance of tobacco products. Avoid excess alcohol. -ASCVD risk score: The  10-year ASCVD risk score (Arnett DK, et al., 2019) is: 4.7%   Values used to calculate the score:     Age: 9 years     Sex: Male     Is Non-Hispanic African American: No     Diabetic: No     Tobacco smoker: No     Systolic Blood Pressure: 123456 mmHg     Is BP treated: Yes     HDL Cholesterol: 46 mg/dL     Total Cholesterol: 186 mg/dL    Plan for follow up: 6-8 weeks  Buford Dresser, MD, PhD, [facility]    Medication Adjustments/Labs and Tests Ordered: Current medicines are reviewed at length with the patient today.  Concerns regarding medicines are outlined above.  Orders Placed This Encounter  Procedures   EKG 12-Lead   No orders of the defined types were placed in this encounter.   Patient Instructions  Medication Instructions:  Your Physician recommend you continue on your current medication as directed.    *If you need a refill on your cardiac medications before your next appointment, please call your pharmacy*   Lab Work: None ordered today   Testing/Procedures: None ordered today    Follow-Up: At [facility], you and your health needs are our priority.  As part of our continuing mission to provide you with exceptional heart care, we have created designated Provider Care Teams.  These Care Teams include your primary Cardiologist (physician) and Advanced Practice Providers  (APPs -  Physician Assistants and Nurse Practitioners) who all work together to provide you with the care you need, when you need it.  We recommend signing up for the patient portal called "MyChart".  Sign up information is provided on this After Visit Summary.  MyChart is used to connect with patients for Virtual Visits (Telemedicine).  Patients are able to view lab/test results, encounter notes, upcoming appointments, etc.  Non-urgent messages can be sent to your provider as well.   To learn more about what you can do with MyChart, go to [email or web address].    Your next appointment:   6-8 week(s)  The format for your next appointment:   In Person  Provider:   Buford Dresser, MD{  Other Instructions Exercise recommendations: The American Heart Association recommends 150 minutes of moderate intensity exercise weekly. Try 30 minutes of moderate intensity exercise 4-5 times per week. This could include walking, jogging, or swimming.     I,Mykaella Javier,acting as a Education administrator for [employer], MD.,have documented all relevant documentation on the behalf of Buford Dresser, MD,as directed by  Buford Dresser, MD while in the presence of Buford Dresser, MD.  I, Buford Dresser, MD, have reviewed all documentation for this visit. The documentation on 11/04/21 for the exam, diagnosis, procedures, and orders are all accurate and complete.   Signed, Buford Dresser, MD PhD 11/04/2021 8:44 AM    [place]

## 2021-11-02 NOTE — Patient Instructions (Signed)
Medication Instructions:  Your Physician recommend you continue on your current medication as directed.    *If you need a refill on your cardiac medications before your next appointment, please call your pharmacy*   Lab Work: None ordered today   Testing/Procedures: None ordered today    Follow-Up: At [facility], you and your health needs are our priority.  As part of our continuing mission to provide you with exceptional heart care, we have created designated Provider Care Teams.  These Care Teams include your primary Cardiologist (physician) and Advanced Practice Providers (APPs -  Physician Assistants and Nurse Practitioners) who all work together to provide you with the care you need, when you need it.  We recommend signing up for the patient portal called "MyChart".  Sign up information is provided on this After Visit Summary.  MyChart is used to connect with patients for Virtual Visits (Telemedicine).  Patients are able to view lab/test results, encounter notes, upcoming appointments, etc.  Non-urgent messages can be sent to your provider as well.   To learn more about what you can do with MyChart, go to [email or web address].    Your next appointment:   6-8 week(s)  The format for your next appointment:   In Person  Provider:   Jodelle Red, MD{  Other Instructions Exercise recommendations: The American Heart Association recommends 150 minutes of moderate intensity exercise weekly. Try 30 minutes of moderate intensity exercise 4-5 times per week. This could include walking, jogging, or swimming.

## 2021-11-04 ENCOUNTER — Encounter (HOSPITAL_BASED_OUTPATIENT_CLINIC_OR_DEPARTMENT_OTHER): Payer: Self-pay | Admitting: Cardiology

## 2021-11-21 ENCOUNTER — Encounter (HOSPITAL_BASED_OUTPATIENT_CLINIC_OR_DEPARTMENT_OTHER): Payer: Self-pay

## 2021-12-14 ENCOUNTER — Ambulatory Visit (HOSPITAL_BASED_OUTPATIENT_CLINIC_OR_DEPARTMENT_OTHER): Payer: BC Managed Care – PPO | Admitting: Cardiology

## 2021-12-19 ENCOUNTER — Ambulatory Visit (HOSPITAL_BASED_OUTPATIENT_CLINIC_OR_DEPARTMENT_OTHER): Payer: BC Managed Care – PPO | Admitting: Cardiology

## 2021-12-19 ENCOUNTER — Encounter (HOSPITAL_BASED_OUTPATIENT_CLINIC_OR_DEPARTMENT_OTHER): Payer: Self-pay | Admitting: Cardiology

## 2021-12-19 VITALS — BP 121/91 | HR 68 | Ht 72.0 in | Wt 217.3 lb

## 2021-12-19 DIAGNOSIS — Z7189 Other specified counseling: Secondary | ICD-10-CM

## 2021-12-19 DIAGNOSIS — I493 Ventricular premature depolarization: Secondary | ICD-10-CM

## 2021-12-19 DIAGNOSIS — R002 Palpitations: Secondary | ICD-10-CM | POA: Diagnosis not present

## 2021-12-19 NOTE — Patient Instructions (Signed)

## 2021-12-19 NOTE — Progress Notes (Signed)
?Cardiology Office Note:   ? ?Date:  12/19/2021  ? ?ID:  Casey Elliott, DOB March 10, 1966, MRN [id number] ? ?PCP:  Casey Decamp, MD  ?Cardiologist:  Buford Dresser, MD ? ?Referring MD: Casey Elliott,*  ? ?CC: Follow-up for palpitations ? ?History of Present Illness:   ? ?Casey Elliott is a 56 y.o. male with a hx of hypertension and anxiety who is seen for follow-up. I initially met him 11/04/2021 as a new consult at the request of Casey Elliott,* for the evaluation and management of palpitations. ? ?History: He saw Dr. Dianah Elliott 09/21/21 complaining of increasing palpitations. They discussed the results of his monitor and he was recommended to start on metoprolol. Beta-blockers were recommended but he requested a second opinion.  ? ?He was seen in the ED 10/23/21 with chest pain. He was driving with his son when he started having tingling in his hands and feet. He felt like he was going to pass out. He called emergency services. Upon arrival, the chest pain resolve but he endorsed nausea, lightheadedness, and palpitations. He reported he was not taking his metoprolol. His potassium was 2.9. He was discharged with potassium supplements. ? ?He saw Dr. Dianah Elliott again 11/01/21. They discussed the use of beta-blockers but he was not interested at the time.  ? ?Today: ?Overall, he is feeling good. He presents a BP log, which shows rare 140s-150s. On average his blood pressure is 110-115/70. He does notice warning signs prior to episodes of low blood pressures. Sometimes he notices mild lightheadedness after standing. ? ?Rarely he continues to have episodes of palpitations. While active he usually doesn't notice any palpitations. Every so often it feels like his palpitations take his breath away. They seem to be worse when he does not sleep well, and when he is at rest. ? ?A few times he has noticed episodes of lightheadedness and a racing heart rate. This is also associated with a "very  light" sensation in his fingers. He is trying to determine any correlations with his hydration and diet. These episodes are not necessarily associated with his palpitations. ? ?He remains active. Yesterday he was able to play hockey without any issues. He denies any physical limitations beyond left shoulder pain. A left total shoulder arthroplasty is planned for October 2023. ? ?He denies any chest pain, shortness of breath, or peripheral edema. No headaches, syncope, orthopnea, or PND. ? ?Past Medical History:  ?Diagnosis Date  ? Chronic prostatitis 08/04/2014  ? Hypertriglyceridemia 08/04/2014  ? Hypogonadism in male 08/04/2014  ? Hypothyroidism 08/04/2014  ? ? ?Past Surgical History:  ?Procedure Laterality Date  ? APPENDECTOMY    ? SHOULDER ARTHROSCOPY    ? TENDON REPAIR    ? ? ?Current Medications: ?Current Outpatient Medications on File Prior to Visit  ?Medication Sig  ? ALPRAZolam (XANAX) 0.5 MG tablet Take 0.5-1 tablets (0.25-0.5 mg total) by mouth at bedtime as needed for anxiety.  ? chlorthalidone (HYGROTON) 25 MG tablet Take 12.5 mg by mouth every morning.  ? fenofibrate 160 MG tablet Take 1 tablet (160 mg total) by mouth daily.  ? fexofenadine (ALLEGRA) 180 MG tablet TAKE 1 TABLET BY MOUTH EVERY DAY  ? levothyroxine (SYNTHROID) 50 MCG tablet Take 1 tablet (50 mcg total) by mouth daily.  ? lisinopril (ZESTRIL) 10 MG tablet TAKE 1 TABLET BY MOUTH EVERY DAY  ? potassium chloride SA (KLOR-CON M) 20 MEQ tablet Take by mouth.  ? tadalafil (CIALIS) 5 MG tablet Take 1 tablet (5  mg total) by mouth daily.  ? clomiPHENE (CLOMID) 50 MG tablet Take 1 tablet (50 mg total) by mouth daily. (Patient not taking: Reported on 12/19/2021)  ? ?No current facility-administered medications on file prior to visit.  ?  ? ?Allergies:   Augmentin [amoxicillin-pot clavulanate], Ciprofloxacin, Levofloxacin, Meperidine, and Meperidine hcl  ? ?Social History  ? ?Tobacco Use  ? Smoking status: Never  ? Smokeless tobacco: Never  ? ? ?Family  History: ?Father with possible stroke ? ?ROS:   ?Please see the history of present illness. ?(+) Palpitations ?(+) Lightheadedness ?(+) "light" numbness in fingers ?(+) Left shoulder pain ?All other systems are reviewed and negative.  ? ? ?EKGs/Labs/Other Studies Reviewed:   ? ?The following studies were reviewed today: ? ?Monitor 10/11/21 ?Patch Wear Time:  7 days and 2 hours (2023-01-08T12:02:57-0500 to 2023-01-15T14:06:49-0500) ?  ?Patient had a min HR of 45 bpm, max HR of 148 bpm, and avg HR of 86 bpm. Predominant underlying rhythm was Sinus Rhythm. Isolated SVEs were rare (<1.0%), and no SVE Couplets or SVE Triplets were present. Isolated VEs were occasional (2.0%, 17749), VE Couplets were rare (<1.0%, 12), and no VE Triplets were present. Ventricular Bigeminy and Trigeminy were present.  ? ?Echo Stress 02/07/21 (Care Everywhere) ?Left Ventricle: Systolic function is normal. EF: 55-60%.  ?  Post-stress Impression: The study is negative and shows no  ?echocardiographic evidence of ischemia.  ?  Post-stress Impression: This study shows a low prognostic risk. ? ?Echo 02/03/15 ?- Left ventricle: The cavity size was normal. Wall thickness was  ?  increased in a pattern of mild LVH. Systolic function was normal.  ?  The estimated ejection fraction was in the range of 55% to 60%.  ?  Wall motion was normal; there were no regional wall motion  ?  abnormalities. Left ventricular diastolic function parameters  ?  were normal.  ?- Left atrium: The atrium was mildly dilated.  ? ?EKG:  EKG is personally reviewed.   ?12/19/2021: EKG was not ordered. ?11/02/21: NSR at 77 bpm. 30 second rhythm strip with 1 PVC ? ?Recent Labs: ?11/01/2021: ALT 17; BUN 14; Creat 0.93; Hemoglobin 14.3; Platelets 194; Potassium 4.2; Sodium 141; TSH 2.91  ?Recent Lipid Panel ?   ?Component Value Date/Time  ? CHOL 186 07/13/2021 0000  ? TRIG 82 07/13/2021 0000  ? HDL 46 07/13/2021 0000  ? CHOLHDL 4.0 07/13/2021 0000  ? VLDL NOT CALC 08/20/2014 0923  ?  Barre 122 (H) 07/13/2021 0000  ? ? ?Physical Exam:   ? ?VS:  BP (!) 121/91   Pulse 68   Ht 6' (1.829 m)   Wt 217 lb 4.8 oz (98.6 kg)   SpO2 98%   BMI 29.47 kg/m?    ? ?Wt Readings from Last 3 Encounters:  ?12/19/21 217 lb 4.8 oz (98.6 kg)  ?11/02/21 212 lb (96.2 kg)  ?11/01/21 211 lb (95.7 kg)  ?  ?GEN: Well nourished, well developed in no acute distress ?HEENT: Normal, moist mucous membranes ?NECK: No JVD ?CARDIAC: regular rhythm, normal S1 and S2, no rubs or gallops. No murmur. ?VASCULAR: Radial and DP pulses 2+ bilaterally. No carotid bruits ?RESPIRATORY:  Clear to auscultation without rales, wheezing or rhonchi  ?ABDOMEN: Soft, non-tender, non-distended ?MUSCULOSKELETAL:  Ambulates independently ?SKIN: Warm and dry, no edema ?NEUROLOGIC:  Alert and oriented x 3. No focal neuro deficits noted. ?PSYCHIATRIC:  Normal affect   ? ?ASSESSMENT:   ? ?1. Palpitations   ?2. PVC (premature ventricular contraction)   ?  3. Counseling on health promotion and disease prevention   ?4. Cardiac risk counseling   ? ?PLAN:   ? ?Palpitations ?Occasional PVCs ?-we have reviewed monitor and echo ?-his total burden on his monitor most recently was 2.0% PVCs. Prior 24 hour holter noted only 1 PVC ?-echo and echo stress unremarkable ?-feeling improved, no long lasting symptoms ?-we did review red flag warning signs that need immediate medical attention ? ?Lightheadedness: discussed avoiding dehydration, especially with the coming warmer weather. Also discussed slow position changes. No full syncope. ? ?Cardiac risk counseling and prevention recommendations: we will also discuss more in follow up ?-recommend heart healthy/Mediterranean diet, with whole grains, fruits, vegetable, fish, lean meats, nuts, and olive oil. Limit salt. ?-recommend moderate walking, 3-5 times/week for 30-50 minutes each session. Aim for at least 150 minutes.week. Goal should be pace of 3 miles/hours, or walking 1.5 miles in 30 minutes ?-recommend avoidance  of tobacco products. Avoid excess alcohol. ?-ASCVD risk score: ?The 10-year ASCVD risk score (Arnett DK, et al., 2019) is: 4.9% ?  Values used to calculate the score: ?    Age: 89 years ?    Sex: Male ?    Is

## 2022-01-18 DIAGNOSIS — M19012 Primary osteoarthritis, left shoulder: Secondary | ICD-10-CM | POA: Diagnosis not present

## 2022-02-03 DIAGNOSIS — M1991 Primary osteoarthritis, unspecified site: Secondary | ICD-10-CM | POA: Diagnosis not present

## 2022-02-03 DIAGNOSIS — M9905 Segmental and somatic dysfunction of pelvic region: Secondary | ICD-10-CM | POA: Diagnosis not present

## 2022-02-03 DIAGNOSIS — M461 Sacroiliitis, not elsewhere classified: Secondary | ICD-10-CM | POA: Diagnosis not present

## 2022-02-03 DIAGNOSIS — M9904 Segmental and somatic dysfunction of sacral region: Secondary | ICD-10-CM | POA: Diagnosis not present

## 2022-02-06 DIAGNOSIS — M461 Sacroiliitis, not elsewhere classified: Secondary | ICD-10-CM | POA: Diagnosis not present

## 2022-02-06 DIAGNOSIS — M9905 Segmental and somatic dysfunction of pelvic region: Secondary | ICD-10-CM | POA: Diagnosis not present

## 2022-02-06 DIAGNOSIS — M9904 Segmental and somatic dysfunction of sacral region: Secondary | ICD-10-CM | POA: Diagnosis not present

## 2022-02-06 DIAGNOSIS — M1991 Primary osteoarthritis, unspecified site: Secondary | ICD-10-CM | POA: Diagnosis not present

## 2022-02-08 DIAGNOSIS — M9901 Segmental and somatic dysfunction of cervical region: Secondary | ICD-10-CM | POA: Diagnosis not present

## 2022-02-08 DIAGNOSIS — M542 Cervicalgia: Secondary | ICD-10-CM | POA: Diagnosis not present

## 2022-02-08 DIAGNOSIS — M546 Pain in thoracic spine: Secondary | ICD-10-CM | POA: Diagnosis not present

## 2022-02-08 DIAGNOSIS — M9902 Segmental and somatic dysfunction of thoracic region: Secondary | ICD-10-CM | POA: Diagnosis not present

## 2022-02-10 DIAGNOSIS — M9901 Segmental and somatic dysfunction of cervical region: Secondary | ICD-10-CM | POA: Diagnosis not present

## 2022-02-10 DIAGNOSIS — M542 Cervicalgia: Secondary | ICD-10-CM | POA: Diagnosis not present

## 2022-02-10 DIAGNOSIS — M9902 Segmental and somatic dysfunction of thoracic region: Secondary | ICD-10-CM | POA: Diagnosis not present

## 2022-02-10 DIAGNOSIS — M546 Pain in thoracic spine: Secondary | ICD-10-CM | POA: Diagnosis not present

## 2022-02-19 ENCOUNTER — Other Ambulatory Visit: Payer: Self-pay | Admitting: Sports Medicine

## 2022-02-19 DIAGNOSIS — F5101 Primary insomnia: Secondary | ICD-10-CM

## 2022-02-21 DIAGNOSIS — M546 Pain in thoracic spine: Secondary | ICD-10-CM | POA: Diagnosis not present

## 2022-02-21 DIAGNOSIS — M542 Cervicalgia: Secondary | ICD-10-CM | POA: Diagnosis not present

## 2022-02-21 DIAGNOSIS — M9907 Segmental and somatic dysfunction of upper extremity: Secondary | ICD-10-CM | POA: Diagnosis not present

## 2022-02-21 DIAGNOSIS — M9901 Segmental and somatic dysfunction of cervical region: Secondary | ICD-10-CM | POA: Diagnosis not present

## 2022-02-21 DIAGNOSIS — M9902 Segmental and somatic dysfunction of thoracic region: Secondary | ICD-10-CM | POA: Diagnosis not present

## 2022-02-28 DIAGNOSIS — M9902 Segmental and somatic dysfunction of thoracic region: Secondary | ICD-10-CM | POA: Diagnosis not present

## 2022-02-28 DIAGNOSIS — M9901 Segmental and somatic dysfunction of cervical region: Secondary | ICD-10-CM | POA: Diagnosis not present

## 2022-02-28 DIAGNOSIS — M546 Pain in thoracic spine: Secondary | ICD-10-CM | POA: Diagnosis not present

## 2022-02-28 DIAGNOSIS — M542 Cervicalgia: Secondary | ICD-10-CM | POA: Diagnosis not present

## 2022-03-03 DIAGNOSIS — M542 Cervicalgia: Secondary | ICD-10-CM | POA: Diagnosis not present

## 2022-03-03 DIAGNOSIS — M9901 Segmental and somatic dysfunction of cervical region: Secondary | ICD-10-CM | POA: Diagnosis not present

## 2022-03-03 DIAGNOSIS — M546 Pain in thoracic spine: Secondary | ICD-10-CM | POA: Diagnosis not present

## 2022-03-03 DIAGNOSIS — M9902 Segmental and somatic dysfunction of thoracic region: Secondary | ICD-10-CM | POA: Diagnosis not present

## 2022-03-08 DIAGNOSIS — M9901 Segmental and somatic dysfunction of cervical region: Secondary | ICD-10-CM | POA: Diagnosis not present

## 2022-03-08 DIAGNOSIS — M542 Cervicalgia: Secondary | ICD-10-CM | POA: Diagnosis not present

## 2022-03-08 DIAGNOSIS — M546 Pain in thoracic spine: Secondary | ICD-10-CM | POA: Diagnosis not present

## 2022-03-08 DIAGNOSIS — M9907 Segmental and somatic dysfunction of upper extremity: Secondary | ICD-10-CM | POA: Diagnosis not present

## 2022-03-08 DIAGNOSIS — M9902 Segmental and somatic dysfunction of thoracic region: Secondary | ICD-10-CM | POA: Diagnosis not present

## 2022-03-15 DIAGNOSIS — M546 Pain in thoracic spine: Secondary | ICD-10-CM | POA: Diagnosis not present

## 2022-03-15 DIAGNOSIS — M9901 Segmental and somatic dysfunction of cervical region: Secondary | ICD-10-CM | POA: Diagnosis not present

## 2022-03-15 DIAGNOSIS — M542 Cervicalgia: Secondary | ICD-10-CM | POA: Diagnosis not present

## 2022-03-15 DIAGNOSIS — M9907 Segmental and somatic dysfunction of upper extremity: Secondary | ICD-10-CM | POA: Diagnosis not present

## 2022-03-15 DIAGNOSIS — M7542 Impingement syndrome of left shoulder: Secondary | ICD-10-CM | POA: Diagnosis not present

## 2022-03-15 DIAGNOSIS — M9902 Segmental and somatic dysfunction of thoracic region: Secondary | ICD-10-CM | POA: Diagnosis not present

## 2022-04-19 ENCOUNTER — Other Ambulatory Visit: Payer: Self-pay | Admitting: Orthopedic Surgery

## 2022-04-19 DIAGNOSIS — M19012 Primary osteoarthritis, left shoulder: Secondary | ICD-10-CM

## 2022-05-29 ENCOUNTER — Other Ambulatory Visit: Payer: BC Managed Care – PPO

## 2022-06-07 ENCOUNTER — Encounter: Payer: Self-pay | Admitting: Sports Medicine

## 2022-06-07 ENCOUNTER — Ambulatory Visit: Payer: BC Managed Care – PPO | Admitting: Sports Medicine

## 2022-06-07 DIAGNOSIS — R7989 Other specified abnormal findings of blood chemistry: Secondary | ICD-10-CM

## 2022-06-07 DIAGNOSIS — E781 Pure hyperglyceridemia: Secondary | ICD-10-CM | POA: Diagnosis not present

## 2022-06-07 DIAGNOSIS — N6321 Unspecified lump in the left breast, upper outer quadrant: Secondary | ICD-10-CM

## 2022-06-07 DIAGNOSIS — E0590 Thyrotoxicosis, unspecified without thyrotoxic crisis or storm: Secondary | ICD-10-CM

## 2022-06-07 DIAGNOSIS — N6320 Unspecified lump in the left breast, unspecified quadrant: Secondary | ICD-10-CM | POA: Insufficient documentation

## 2022-06-07 DIAGNOSIS — Z01818 Encounter for other preprocedural examination: Secondary | ICD-10-CM

## 2022-06-07 DIAGNOSIS — E291 Testicular hypofunction: Secondary | ICD-10-CM | POA: Diagnosis not present

## 2022-06-07 NOTE — Assessment & Plan Note (Signed)
Amarien is also noted a breast mass left breast, on exam there is a subcentimeter well-defined rounded nodule, approximately 1 to 2 cm lateral from the left nipple. Suspect fibrocystic changes but we do need to do a full work-up including mammogram and ultrasound diagnostic at the breast center. If cystic changes noted and continues to have pain I'd be happy to drain the cyst.

## 2022-06-07 NOTE — Assessment & Plan Note (Signed)
Historically symptoms have been well controlled on Clomid, unfortunately he is unable to get it due to price. He would like me to recheck his testosterone levels, at his age we can certainly use simple Depo testosterone.

## 2022-06-07 NOTE — Assessment & Plan Note (Signed)
Casey Elliott is getting his shoulder replaced, he is here for surgical clearance visit. He has greater than 4 metabolic equivalents of cardiac capacity, he is able to walk up 2-3 flights of stairs easily without chest pain, he can walk several blocks without any chest pain. EKG is normal today. He is not on any medications we would need to stop preoperatively, he is cleared for intermediate risk noncardiac surgery such as a shoulder replacement.

## 2022-06-07 NOTE — Progress Notes (Addendum)
    Procedures performed today:    Twelve-lead ECG performed interpreted by me today, it shows normal sinus rhythm with normal axis, ventricular rate of 81 bpm.  No PR, ST changes.  Independent interpretation of notes and tests performed by another provider:   None.  Brief History, Exam, Impression, and Recommendations:    Preop examination Arnette Norris is getting his shoulder replaced, he is here for surgical clearance visit. He has greater than 4 metabolic equivalents of cardiac capacity, he is able to walk up 2-3 flights of stairs easily without chest pain, he can walk several blocks without any chest pain. EKG is normal today. He is not on any medications we would need to stop preoperatively, he is cleared for intermediate risk noncardiac surgery such as a shoulder replacement.  Breast mass, left Obbie is also noted a breast mass left breast, on exam there is a subcentimeter well-defined rounded nodule, approximately 1 to 2 cm lateral from the left nipple. Suspect fibrocystic changes but we do need to do a full work-up including mammogram and ultrasound diagnostic at the breast center. If cystic changes noted and continues to have pain I'd be happy to drain the cyst.  Secondary male hypogonadism Historically symptoms have been well controlled on Clomid, unfortunately he is unable to get it due to price. He would like me to recheck his testosterone levels, at his age we can certainly use simple Depo testosterone.  Hyperthyroidism TSH low, T3 and T4 elevated raising the suspicion of hyperthyroidism and Graves' disease, this may have been a cause of his palpitations in the past. He does need to see an endocrinologist with these numbers.  I would also like for him to have thyroid-stimulating immunoglobulin levels checked as well as a thyroid radioactive uptake scan for confirmation.    ____________________________________________ Gwen Her. Dianah Field, M.D., ABFM., CAQSM.,  AME. Primary Care and Sports Medicine [facility]  Adjunct Professor of [employer] [facility] [employer] [facility] [employer]  Risk manager

## 2022-06-09 DIAGNOSIS — E291 Testicular hypofunction: Secondary | ICD-10-CM | POA: Diagnosis not present

## 2022-06-09 DIAGNOSIS — E781 Pure hyperglyceridemia: Secondary | ICD-10-CM | POA: Diagnosis not present

## 2022-06-09 DIAGNOSIS — E0590 Thyrotoxicosis, unspecified without thyrotoxic crisis or storm: Secondary | ICD-10-CM | POA: Diagnosis not present

## 2022-06-10 NOTE — Addendum Note (Signed)
Addended by: Silverio Decamp on: 06/10/2022 08:13 PM   Modules accepted: Orders

## 2022-06-12 NOTE — Assessment & Plan Note (Signed)
TSH low, T3 and T4 elevated raising the suspicion of hyperthyroidism and Graves' disease, this may have been a cause of his palpitations in the past. He does need to see an endocrinologist with these numbers.  I would also like for him to have thyroid-stimulating immunoglobulin levels checked as well as a thyroid radioactive uptake scan for confirmation.

## 2022-06-12 NOTE — Addendum Note (Signed)
Addended by: Silverio Decamp on: 06/12/2022 11:39 AM   Modules accepted: Orders

## 2022-06-13 ENCOUNTER — Other Ambulatory Visit: Payer: Self-pay

## 2022-06-13 DIAGNOSIS — E0590 Thyrotoxicosis, unspecified without thyrotoxic crisis or storm: Secondary | ICD-10-CM

## 2022-06-14 LAB — COMPLETE METABOLIC PANEL WITH GFR
AG Ratio: 2 (calc) (ref 1.0–2.5)
ALT: 23 U/L (ref 9–46)
AST: 15 U/L (ref 10–35)
Albumin: 4.5 g/dL (ref 3.6–5.1)
Alkaline phosphatase (APISO): 69 U/L (ref 35–144)
BUN: 22 mg/dL (ref 7–25)
CO2: 31 mmol/L (ref 20–32)
Calcium: 9.5 mg/dL (ref 8.6–10.3)
Chloride: 103 mmol/L (ref 98–110)
Creat: 0.84 mg/dL (ref 0.70–1.30)
Globulin: 2.2 g/dL (calc) (ref 1.9–3.7)
Glucose, Bld: 95 mg/dL (ref 65–99)
Potassium: 4.2 mmol/L (ref 3.5–5.3)
Sodium: 141 mmol/L (ref 135–146)
Total Bilirubin: 0.7 mg/dL (ref 0.2–1.2)
Total Protein: 6.7 g/dL (ref 6.1–8.1)
eGFR: 104 mL/min/{1.73_m2} (ref >=60)

## 2022-06-14 LAB — LIPID PANEL
Cholesterol: 194 mg/dL (ref <200)
HDL: 33 mg/dL — ABNORMAL LOW (ref >=40)
LDL Cholesterol (Calc): 134 mg/dL (calc) — ABNORMAL HIGH
Non-HDL Cholesterol (Calc): 161 mg/dL (calc) — ABNORMAL HIGH (ref <130)
Total CHOL/HDL Ratio: 5.9 (calc) — ABNORMAL HIGH (ref <5.0)
Triglycerides: 147 mg/dL (ref <150)

## 2022-06-14 LAB — HEMOGLOBIN A1C
Hgb A1c MFr Bld: 5.1 % of total Hgb (ref <5.7)
Mean Plasma Glucose: 100 mg/dL
eAG (mmol/L): 5.5 mmol/L

## 2022-06-14 LAB — TEST AUTHORIZATION

## 2022-06-14 LAB — T3, FREE: T3, Free: 5 pg/mL — ABNORMAL HIGH (ref 2.3–4.2)

## 2022-06-14 LAB — TESTOSTERONE, FREE & TOTAL
Free Testosterone: 67.2 pg/mL (ref 35.0–155.0)
Testosterone, Total, LC-MS-MS: 458 ng/dL (ref 250–1100)

## 2022-06-14 LAB — CBC
HCT: 43.8 % (ref 38.5–50.0)
Hemoglobin: 14.9 g/dL (ref 13.2–17.1)
MCH: 32 pg (ref 27.0–33.0)
MCHC: 34 g/dL (ref 32.0–36.0)
MCV: 94.2 fL (ref 80.0–100.0)
MPV: 9.9 fL (ref 7.5–12.5)
Platelets: 231 10*3/uL (ref 140–400)
RBC: 4.65 10*6/uL (ref 4.20–5.80)
RDW: 11.9 % (ref 11.0–15.0)
WBC: 3.4 10*3/uL — ABNORMAL LOW (ref 3.8–10.8)

## 2022-06-14 LAB — PSA, TOTAL AND FREE
PSA, % Free: 17 % (calc) — ABNORMAL LOW (ref >25)
PSA, Free: 0.5 ng/mL
PSA, Total: 2.9 ng/mL (ref <=4.0)

## 2022-06-14 LAB — TSH: TSH: 0.13 mIU/L — ABNORMAL LOW (ref 0.40–4.50)

## 2022-06-14 LAB — T4, FREE: Free T4: 1.9 ng/dL — ABNORMAL HIGH (ref 0.8–1.8)

## 2022-06-21 ENCOUNTER — Ambulatory Visit
Admission: RE | Admit: 2022-06-21 | Discharge: 2022-06-21 | Disposition: A | Discharge status: Home / Self Care | Payer: BC Managed Care – PPO | Source: Ambulatory Visit | Attending: Orthopedic Surgery | Admitting: Orthopedic Surgery

## 2022-06-21 ENCOUNTER — Encounter: Payer: Self-pay | Admitting: Sports Medicine

## 2022-06-21 DIAGNOSIS — M19012 Primary osteoarthritis, left shoulder: Secondary | ICD-10-CM | POA: Diagnosis not present

## 2022-06-28 ENCOUNTER — Ambulatory Visit
Admission: RE | Admit: 2022-06-28 | Discharge: 2022-06-28 | Disposition: A | Discharge status: Home / Self Care | Payer: BC Managed Care – PPO | Source: Ambulatory Visit | Attending: Sports Medicine | Admitting: Sports Medicine

## 2022-06-28 DIAGNOSIS — N62 Hypertrophy of breast: Secondary | ICD-10-CM | POA: Diagnosis not present

## 2022-06-28 DIAGNOSIS — N6321 Unspecified lump in the left breast, upper outer quadrant: Secondary | ICD-10-CM

## 2022-06-30 ENCOUNTER — Other Ambulatory Visit (HOSPITAL_COMMUNITY): Payer: BC Managed Care – PPO

## 2022-07-12 ENCOUNTER — Ambulatory Visit: Payer: BC Managed Care – PPO | Admitting: Family Medicine

## 2022-07-12 ENCOUNTER — Encounter: Payer: Self-pay | Admitting: Family Medicine

## 2022-07-12 VITALS — BP 122/80 | HR 81 | Ht 72.0 in | Wt 209.0 lb

## 2022-07-12 DIAGNOSIS — J019 Acute sinusitis, unspecified: Secondary | ICD-10-CM | POA: Diagnosis not present

## 2022-07-12 DIAGNOSIS — B9689 Other specified bacterial agents as the cause of diseases classified elsewhere: Secondary | ICD-10-CM | POA: Diagnosis not present

## 2022-07-12 DIAGNOSIS — N451 Epididymitis: Secondary | ICD-10-CM | POA: Insufficient documentation

## 2022-07-12 MED ORDER — DOXYCYCLINE HYCLATE 100 MG PO TABS: 100.0000 mg | ORAL_TABLET | Freq: Two times a day (BID) | ORAL | 0 refills | Status: AC

## 2022-07-12 NOTE — Patient Instructions (Signed)
Use flonase as cross treatment

## 2022-07-12 NOTE — Progress Notes (Signed)
Acute Office Visit  Subjective:     Patient ID: Casey Elliott, male    DOB: 02-21-1966, 56 y.o.   MRN: [id number]  Chief Complaint  Patient presents with   Sinusitis    HPI Patient is in today for sinus pain or pressure that has been going on for 7 days. He thought he was getting better a few days ago but then started to get worse. Admits to eye pressure and headaches. Denies body aches/fevers/chills.  Pt also has concerns of testicular pain and has a hx of epididymitis. Says it has been persistent for a few weeks.   Review of Systems  Constitutional:  Negative for chills and fever.  HENT:  Positive for congestion and sinus pain.   Respiratory:  Negative for cough and shortness of breath.   Cardiovascular:  Negative for chest pain.  Neurological:  Negative for headaches.        Objective:    BP 122/80   Pulse 81   Ht 6' (1.829 m)   Wt 209 lb (94.8 kg)   SpO2 100%   BMI 28.35 kg/m    Physical Exam Vitals and nursing note reviewed.  Constitutional:      General: He is not in acute distress.    Appearance: Normal appearance.  HENT:     Head: Normocephalic and atraumatic.     Comments: Tenderness to palpation of maxillary and frontal sinuses bilaterally    Right Ear: External ear normal.     Left Ear: External ear normal.     Nose:     Comments: Erythema of nares bilaterally    Mouth/Throat:     Pharynx: Posterior oropharyngeal erythema present.  Eyes:     Conjunctiva/sclera: Conjunctivae normal.  Cardiovascular:     Rate and Rhythm: Normal rate and regular rhythm.  Pulmonary:     Effort: Pulmonary effort is normal.     Breath sounds: Normal breath sounds.  Genitourinary:    Comments: Chaperone present during testicular exam. No erythema or swelling. No tenderness to palpation of testicles. No loss of cremasteric reflex.  Neurological:     General: No focal deficit present.     Mental Status: He is alert and oriented to person, place, and time.   Psychiatric:        Mood and Affect: Mood normal.        Behavior: Behavior normal.        Thought Content: Thought content normal.        Judgment: Judgment normal.     No results found for any visits on 07/12/22.      Assessment & Plan:   Problem List Items Addressed This Visit       Respiratory   Acute bacterial sinusitis - Primary    - pleasant 56 yo male presents with sinus pain and pressure for 7 days with worsening of symptoms. Due to duration and worsening of symptoms throughout course pt should be treated with abx  - recommended flonase to help with nasal erythema - will treat with doxycycline to help decrease inflammation - RTC If no better      Relevant Medications   doxycycline (VIBRA-TABS) 100 MG tablet     Genitourinary   Epididymitis    - based on exam less likely this is testicular torsion and physical exam did not show varicocele nor were any masses palpated. Higher likelihood of epididymitis based on patient history and exam  - doxycycline for the sinus infection can treat  epididymitis. Told pt if no better to RTC as we may need to further investigate or give a different abx.        Meds ordered this encounter  Medications   doxycycline (VIBRA-TABS) 100 MG tablet    Sig: Take 1 tablet (100 mg total) by mouth 2 (two) times daily for 7 days.    Dispense:  14 tablet    Refill:  0    Return if symptoms worsen or fail to improve.  Charlton Amor, DO

## 2022-07-12 NOTE — Assessment & Plan Note (Addendum)
-   pleasant 56 yo male presents with sinus pain and pressure for 7 days with worsening of symptoms. Due to duration and worsening of symptoms throughout course pt should be treated with abx  - recommended flonase to help with nasal erythema - will treat with doxycycline to help decrease inflammation - RTC If no better

## 2022-07-12 NOTE — Assessment & Plan Note (Signed)
-   based on exam less likely this is testicular torsion and physical exam did not show varicocele nor were any masses palpated. Higher likelihood of epididymitis based on patient history and exam  - doxycycline for the sinus infection can treat epididymitis. Told pt if no better to RTC as we may need to further investigate or give a different abx.

## 2022-07-25 ENCOUNTER — Ambulatory Visit (HOSPITAL_COMMUNITY)
Admission: RE | Admit: 2022-07-25 | Discharge: 2022-07-25 | Disposition: A | Discharge status: Home / Self Care | Payer: BC Managed Care – PPO | Source: Ambulatory Visit | Attending: Sports Medicine | Admitting: Sports Medicine

## 2022-07-25 ENCOUNTER — Encounter: Payer: Self-pay | Admitting: Sports Medicine

## 2022-07-25 DIAGNOSIS — R7989 Other specified abnormal findings of blood chemistry: Secondary | ICD-10-CM | POA: Diagnosis not present

## 2022-07-25 DIAGNOSIS — E0590 Thyrotoxicosis, unspecified without thyrotoxic crisis or storm: Secondary | ICD-10-CM | POA: Diagnosis present

## 2022-07-26 ENCOUNTER — Other Ambulatory Visit: Payer: Self-pay

## 2022-07-26 ENCOUNTER — Ambulatory Visit (HOSPITAL_COMMUNITY)
Admission: RE | Admit: 2022-07-26 | Discharge: 2022-07-26 | Disposition: A | Discharge status: Home / Self Care | Payer: BC Managed Care – PPO | Source: Ambulatory Visit | Attending: Sports Medicine | Admitting: Sports Medicine

## 2022-07-26 ENCOUNTER — Other Ambulatory Visit: Payer: Self-pay | Admitting: Sports Medicine

## 2022-07-26 DIAGNOSIS — I10 Essential (primary) hypertension: Secondary | ICD-10-CM

## 2022-07-26 DIAGNOSIS — E0590 Thyrotoxicosis, unspecified without thyrotoxic crisis or storm: Secondary | ICD-10-CM | POA: Diagnosis not present

## 2022-07-26 DIAGNOSIS — R7989 Other specified abnormal findings of blood chemistry: Secondary | ICD-10-CM

## 2022-07-26 MED ORDER — SODIUM IODIDE I-123 7.4 MBQ CAPS
431.5000 | ORAL_CAPSULE | Freq: Once | ORAL | Status: AC
  Administered 2022-07-25: 431.5 via ORAL

## 2022-07-26 MED ORDER — LISINOPRIL 10 MG PO TABS: 10.0000 mg | ORAL_TABLET | Freq: Every day | ORAL | 0 refills | Status: DC

## 2022-07-26 MED ORDER — CHLORTHALIDONE 25 MG PO TABS: 12.5000 mg | ORAL_TABLET | Freq: Every morning | ORAL | 0 refills | Status: DC

## 2022-07-31 ENCOUNTER — Ambulatory Visit: Payer: BC Managed Care – PPO | Admitting: Sports Medicine

## 2022-07-31 ENCOUNTER — Encounter: Payer: Self-pay | Admitting: Sports Medicine

## 2022-07-31 VITALS — Wt 219.0 lb

## 2022-07-31 DIAGNOSIS — M19012 Primary osteoarthritis, left shoulder: Secondary | ICD-10-CM

## 2022-07-31 DIAGNOSIS — R7989 Other specified abnormal findings of blood chemistry: Secondary | ICD-10-CM | POA: Diagnosis not present

## 2022-07-31 DIAGNOSIS — E0590 Thyrotoxicosis, unspecified without thyrotoxic crisis or storm: Secondary | ICD-10-CM | POA: Diagnosis not present

## 2022-07-31 DIAGNOSIS — E291 Testicular hypofunction: Secondary | ICD-10-CM

## 2022-07-31 MED ORDER — TADALAFIL 5 MG PO TABS: 5.0000 mg | ORAL_TABLET | Freq: Every day | ORAL | 11 refills | Status: AC

## 2022-07-31 NOTE — Assessment & Plan Note (Signed)
Loistine Chance also had a very low TSH, with elevated T3 and T4 levels raising the suspicion for hyperthyroidism with Graves' disease. He has since stopped his levothyroxine. A thyroid uptake scan was negative. Rechecking T3, T4, TSH and thyroid-stimulating immunoglobulin today.  Update: Still awaiting thyroid-stimulating immunoglobulin, TSH is very high, restarting levothyroxine but at 25 mcg, recheck in 6 weeks.

## 2022-07-31 NOTE — Progress Notes (Addendum)
    Procedures performed today:    None.  Independent interpretation of notes and tests performed by another provider:   None.  Brief History, Exam, Impression, and Recommendations:    Primary osteoarthritis of left shoulder Scheduled for total shoulder arthroplasty, strength and motion are good, his rotator cuff is likely intact so he is having a standard replacement rather than a reverse arthroplasty.  Secondary male hypogonadism Phil did have secondary hypogonadism, symptoms had historically been controlled on Clomid and testosterone levels are normal on a recent check. He still has a significant lack of sex drive and on further questioning we think it coincides with a loss of erectile function, which decreased his desire to even try. He was on Cialis 5 mg daily, had a lot of anxiety with this as he was having some cardiac work-up. I would like him to restart his Cialis 5 mg every day with a 4-week follow-up. We can dose titrate upwards if need be. I did explain to him that lack of libido in males was typically more likely due to erectile dysfunction rather than true primary sexual desire disorder. He would like me to check his estrogen level so I am happy to do this today.  Hyperthyroidism Loistine Chance also had a very low TSH, with elevated T3 and T4 levels raising the suspicion for hyperthyroidism with Graves' disease. He has since stopped his levothyroxine. A thyroid uptake scan was negative. Rechecking T3, T4, TSH and thyroid-stimulating immunoglobulin today.  Update: Still awaiting thyroid-stimulating immunoglobulin, TSH is very high, restarting levothyroxine but at 25 mcg, recheck in 6 weeks.    ____________________________________________ Ihor Austin. Benjamin Stain, M.D., ABFM., CAQSM., AME. Primary Care and Sports Medicine [facility]  Adjunct Professor of Family Medicine  [employer]  Stage manager

## 2022-07-31 NOTE — Assessment & Plan Note (Signed)
Scheduled for total shoulder arthroplasty, strength and motion are good, his rotator cuff is likely intact so he is having a standard replacement rather than a reverse arthroplasty.

## 2022-07-31 NOTE — Assessment & Plan Note (Signed)
Casey Elliott did have secondary hypogonadism, symptoms had historically been controlled on Clomid and testosterone levels are normal on a recent check. He still has a significant lack of sex drive and on further questioning we think it coincides with a loss of erectile function, which decreased his desire to even try. He was on Cialis 5 mg daily, had a lot of anxiety with this as he was having some cardiac work-up. I would like him to restart his Cialis 5 mg every day with a 4-week follow-up. We can dose titrate upwards if need be. I did explain to him that lack of libido in males was typically more likely due to erectile dysfunction rather than true primary sexual desire disorder. He would like me to check his estrogen level so I am happy to do this today.

## 2022-08-01 ENCOUNTER — Encounter: Payer: Self-pay | Admitting: Sports Medicine

## 2022-08-01 DIAGNOSIS — R7989 Other specified abnormal findings of blood chemistry: Secondary | ICD-10-CM

## 2022-08-01 MED ORDER — LEVOTHYROXINE SODIUM 25 MCG PO TABS: 25.0000 ug | ORAL_TABLET | Freq: Every day | ORAL | 3 refills | Status: DC

## 2022-08-01 NOTE — Addendum Note (Signed)
Addended by: Monica Becton on: 08/01/2022 04:03 PM   Modules accepted: Orders

## 2022-08-03 LAB — ESTROGENS, TOTAL: Estrogen: 129 pg/mL (ref <=404)

## 2022-08-03 LAB — TSH: TSH: 9 mIU/L — ABNORMAL HIGH (ref 0.40–4.50)

## 2022-08-03 LAB — THYROID STIMULATING IMMUNOGLOBULIN: TSI: <89 % baseline (ref <140)

## 2022-08-03 NOTE — Telephone Encounter (Signed)
Our referral notes states was sent to the correct facility. Called patient and informed and he will call and check on this.

## 2022-08-08 ENCOUNTER — Telehealth: Payer: Self-pay

## 2022-08-08 DIAGNOSIS — M19012 Primary osteoarthritis, left shoulder: Secondary | ICD-10-CM | POA: Diagnosis not present

## 2022-08-08 DIAGNOSIS — G8918 Other acute postprocedural pain: Secondary | ICD-10-CM | POA: Diagnosis not present

## 2022-08-08 NOTE — Telephone Encounter (Signed)
Initiated Prior authorization for:Tadalafil 5MG  tablets Via: Covermymeds Case/Key:[id number]  Status: approved  as of /08/08/22 Reason:Coverage Start Date:07/05/2022;Coverage End Date:08/05/2023; Notified Pt via: Mychart

## 2022-08-10 DIAGNOSIS — Z881 Allergy status to other antibiotic agents status: Secondary | ICD-10-CM | POA: Diagnosis not present

## 2022-08-10 DIAGNOSIS — R918 Other nonspecific abnormal finding of lung field: Secondary | ICD-10-CM | POA: Diagnosis not present

## 2022-08-10 DIAGNOSIS — I10 Essential (primary) hypertension: Secondary | ICD-10-CM | POA: Diagnosis not present

## 2022-08-10 DIAGNOSIS — R079 Chest pain, unspecified: Secondary | ICD-10-CM | POA: Diagnosis not present

## 2022-08-10 DIAGNOSIS — Z888 Allergy status to other drugs, medicaments and biological substances status: Secondary | ICD-10-CM | POA: Diagnosis not present

## 2022-08-10 DIAGNOSIS — Z79890 Hormone replacement therapy (postmenopausal): Secondary | ICD-10-CM | POA: Diagnosis not present

## 2022-08-10 DIAGNOSIS — J189 Pneumonia, unspecified organism: Secondary | ICD-10-CM | POA: Diagnosis not present

## 2022-08-10 DIAGNOSIS — R072 Precordial pain: Secondary | ICD-10-CM | POA: Diagnosis not present

## 2022-08-10 DIAGNOSIS — Z79899 Other long term (current) drug therapy: Secondary | ICD-10-CM | POA: Diagnosis not present

## 2022-08-10 DIAGNOSIS — R000 Tachycardia, unspecified: Secondary | ICD-10-CM | POA: Diagnosis not present

## 2022-08-10 DIAGNOSIS — J168 Pneumonia due to other specified infectious organisms: Secondary | ICD-10-CM | POA: Diagnosis not present

## 2022-08-10 DIAGNOSIS — R1032 Left lower quadrant pain: Secondary | ICD-10-CM | POA: Diagnosis not present

## 2022-08-10 DIAGNOSIS — R55 Syncope and collapse: Secondary | ICD-10-CM | POA: Diagnosis not present

## 2022-08-10 DIAGNOSIS — R0789 Other chest pain: Secondary | ICD-10-CM | POA: Diagnosis not present

## 2022-08-10 DIAGNOSIS — R0602 Shortness of breath: Secondary | ICD-10-CM | POA: Diagnosis not present

## 2022-08-14 ENCOUNTER — Encounter (HOSPITAL_BASED_OUTPATIENT_CLINIC_OR_DEPARTMENT_OTHER): Payer: Self-pay

## 2022-08-16 ENCOUNTER — Ambulatory Visit (HOSPITAL_BASED_OUTPATIENT_CLINIC_OR_DEPARTMENT_OTHER): Payer: BC Managed Care – PPO | Admitting: Cardiology

## 2022-08-16 ENCOUNTER — Encounter (HOSPITAL_BASED_OUTPATIENT_CLINIC_OR_DEPARTMENT_OTHER): Payer: Self-pay | Admitting: Cardiology

## 2022-08-16 VITALS — BP 122/84 | HR 96 | Ht 72.0 in | Wt 214.0 lb

## 2022-08-16 DIAGNOSIS — Z7189 Other specified counseling: Secondary | ICD-10-CM | POA: Diagnosis not present

## 2022-08-16 DIAGNOSIS — I493 Ventricular premature depolarization: Secondary | ICD-10-CM

## 2022-08-16 DIAGNOSIS — R0602 Shortness of breath: Secondary | ICD-10-CM

## 2022-08-16 DIAGNOSIS — R079 Chest pain, unspecified: Secondary | ICD-10-CM | POA: Diagnosis not present

## 2022-08-16 NOTE — Patient Instructions (Signed)
Medication Instructions:  Your physician recommends that you continue on your current medications as directed. Please refer to the Current Medication list given to you today.  *If you need a refill on your cardiac medications before your next appointment, please call your pharmacy*  Follow-Up: At [facility], you and your health needs are our priority.  As part of our continuing mission to provide you with exceptional heart care, we have created designated Provider Care Teams.  These Care Teams include your primary Cardiologist (physician) and Advanced Practice Providers (APPs -  Physician Assistants and Nurse Practitioners) who all work together to provide you with the care you need, when you need it.  We recommend signing up for the patient portal called "MyChart".  Sign up information is provided on this After Visit Summary.  MyChart is used to connect with patients for Virtual Visits (Telemedicine).  Patients are able to view lab/test results, encounter notes, upcoming appointments, etc.  Non-urgent messages can be sent to your provider as well.   To learn more about what you can do with MyChart, go to [email or web address].    Your next appointment:   1 year(s)  The format for your next appointment:   In Person  Provider:   Bridgette Christopher, MD      

## 2022-08-16 NOTE — Progress Notes (Signed)
Cardiology Office Note:    Date:  11/04/2021   ID:  Casey Elliott, DOB 11-30-1965, MRN [id number]  PCP:  Monica Becton, MD  Cardiologist:  Jodelle Red, MD  Referring MD: Monica Becton,*   CC: Follow-up for palpitations  History of Present Illness:    Casey Elliott is a 56 y.o. male with a hx of hypertension and anxiety who is seen for follow-up. He was initially seen on 12/19/2021  at the request of Monica Becton for the evaluation and management of palpitations.  He saw Dr. Benjamin Stain 09/21/21 complaining of increasing palpitations. They discussed the results of his monitor and he was started on metoprolol. Beta-blockers were recommended but he requested a second opinion.   He was seen in the ED 10/23/21 with chest pain. He was driving with his son when he started having tingling in his hands and feet. He felt like he was going to pass out. He called emergency services. Upon arrival, the chest pain resolve but he endorsed nausea, lightheadedness, and palpitations. He reported he was not taking his metoprolol. His potassium was 2.9. He was discharged with potassium supplements.  He saw Dr. Benjamin Stain again 11/01/21. They discussed the use of beta-blockers but he was not interested at the time.   At his last visit, he experienced intense palpitations that prompted an emergency services visit. At the ER, he was told he had low potassium, a severe panic attack, and hypertension of 195/115 with 107 bpm. Since that time, his palpitations were continually intermittent He stopped his medications and had been slowly re-adding them to see if it stopped his palpitations. He had been under significant life stressors and had been experiencing chest pain.  While at the ER on 08/10/2022, he had presented with chest pain in the setting of immediate postoperative period of a shoulder replacement. He was hypertensive and mildly tachycardic. His symptoms were felt due  to pneumonia. He was treated with antibiotics and referred to outpatient cardiology follow-up. His EKG showed sinus tachycardia with a 110 ventricular rate. Cannot rule out inferior infarct.  Today, he is accompanied by a family member. Following his left shoulder replacement surgery, he has had daily episodes of SOB, chest pain, and fatigue occur between 3pm and the evenings. These episodes have lasted about 35 mins to 1 hour. These episodes are also sometimes brought about upon eating. During these episodes, he may also experience diaphoresis, dizziness, and his face feeling flushed. Of note, his chest pain is usually localized in his central chest, but may occur in his left chest. Yesterday when bending over, he did notice mild improvement in his pain.  On Tuesday night, he experienced pain in his center chest. He also describes that he "could breathe outward but not upward". He previously believed that his symptoms were due to a nerve block. He notes that his episodes had continued to worsen every day. On the day prior to his ED visit, he took 500 mg naprosyn and also took 2 OxyContin 5 1/2 hours apart. He continued to have SOB and chest pain upon awakening. He also experienced constipation and stomach cramps and had not used the restroom since Monday. He consequently took 3 Dulcolax. While using the restroom, he experienced sudden cold sweats and "didn't feel right". His BP was "all over", with readings as low as 64/48. He also experienced intermittent elevated HR, such as 125 bpm. He notes that he feels his elevated HR in his neck. His tachycardia continued to  elevate. After all of these symptoms he consequently presented to the ER.  He is currently trying to become more active, but is limited by his shoulder pain, which causes him to sit as needed.  He notes that he does not recall his last PVC.  He denies any peripheral edema, headaches or PND.    Past Medical History:  Diagnosis Date    Chronic prostatitis 08/04/2014   Hypertriglyceridemia 08/04/2014   Hypogonadism in male 08/04/2014   Hypothyroidism 08/04/2014    Past Surgical History:  Procedure Laterality Date   APPENDECTOMY     SHOULDER ARTHROSCOPY     TENDON REPAIR      Current Medications: Current Outpatient Medications on File Prior to Visit  Medication Sig   ALPRAZolam (XANAX) 0.5 MG tablet Take 0.5-1 tablets (0.25-0.5 mg total) by mouth at bedtime as needed for anxiety.   chlorthalidone (HYGROTON) 25 MG tablet Take 12.5 mg by mouth every morning.   clomiPHENE (CLOMID) 50 MG tablet Take 1 tablet (50 mg total) by mouth daily.   fenofibrate 160 MG tablet Take 1 tablet (160 mg total) by mouth daily.   fexofenadine (ALLEGRA) 180 MG tablet TAKE 1 TABLET BY MOUTH EVERY DAY   levothyroxine (SYNTHROID) 50 MCG tablet Take 1 tablet (50 mcg total) by mouth daily.   lisinopril (ZESTRIL) 10 MG tablet TAKE 1 TABLET BY MOUTH EVERY DAY   potassium chloride SA (KLOR-CON M) 20 MEQ tablet Take by mouth.   tadalafil (CIALIS) 5 MG tablet Take 1 tablet (5 mg total) by mouth daily.   No current facility-administered medications on file prior to visit.     Allergies:   Augmentin [amoxicillin-pot clavulanate], Ciprofloxacin, Levofloxacin, Meperidine, and Meperidine hcl   Social History   Tobacco Use   Smoking status: Never   Smokeless tobacco: Never    Family History: Father with possible stroke  ROS:   Please see the history of present illness. (+) Left shoulder pain (+) chest pain (+) SOB (+) constipation (+) stomach cramps (+) fatigue All other systems are reviewed and negative.    EKGs/Labs/Other Studies Reviewed:    The following studies were reviewed today:  Monitor 10/11/21 Patch Wear Time:  7 days and 2 hours (2023-01-08T12:02:57-0500 to 2023-01-15T14:06:49-0500)   Patient had a min HR of 45 bpm, max HR of 148 bpm, and avg HR of 86 bpm. Predominant underlying rhythm was Sinus Rhythm. Isolated SVEs  were rare (<1.0%), and no SVE Couplets or SVE Triplets were present. Isolated VEs were occasional (2.0%, 17749), VE Couplets were rare (<1.0%, 12), and no VE Triplets were present. Ventricular Bigeminy and Trigeminy were present.   Echo Stress 02/07/21 (Care Everywhere) Left Ventricle: Systolic function is normal. EF: 55-60%.    Post-stress Impression: The study is negative and shows no  echocardiographic evidence of ischemia.    Post-stress Impression: This study shows a low prognostic risk.  Echo 02/03/15 - Left ventricle: The cavity size was normal. Wall thickness was    increased in a pattern of mild LVH. Systolic function was normal.    The estimated ejection fraction was in the range of 55% to 60%.    Wall motion was normal; there were no regional wall motion    abnormalities. Left ventricular diastolic function parameters    were normal.  - Left atrium: The atrium was mildly dilated.   EKG:  EKG is personally reviewed.   08/16/2022: not ordered today 11/02/21: NSR at 77 bpm. 30 second rhythm strip with 1 PVC  Recent Labs: 11/01/2021: ALT 17; BUN 14; Creat 0.93; Hemoglobin 14.3; Platelets 194; Potassium 4.2; Sodium 141; TSH 2.91  Recent Lipid Panel    Component Value Date/Time   CHOL 186 07/13/2021 0000   TRIG 82 07/13/2021 0000   HDL 46 07/13/2021 0000   CHOLHDL 4.0 07/13/2021 0000   VLDL NOT CALC 08/20/2014 0923   LDLCALC 122 (H) 07/13/2021 0000    Physical Exam:    VS:  BP 118/72   Pulse 77   Ht 6' (1.829 m)   Wt 212 lb (96.2 kg)   BMI 28.75 kg/m     Wt Readings from Last 3 Encounters:  11/02/21 212 lb (96.2 kg)  11/01/21 211 lb (95.7 kg)  09/21/21 219 lb 1.3 oz (99.4 kg)    GEN: Well nourished, well developed in no acute distress HEENT: Normal, moist mucous membranes NECK: No JVD CARDIAC: regular rhythm, normal S1 and S2, no rubs or gallops. No murmur. VASCULAR: Radial and DP pulses 2+ bilaterally. No carotid bruits RESPIRATORY:  Inspiratory breath sounds  consistent with atelectasis, no rales, wheezing, or rhonchi  ABDOMEN: Soft, non-tender, non-distended MUSCULOSKELETAL:  Ambulates independently SKIN: Warm and dry, no edema NEUROLOGIC:  Alert and oriented x 3. No focal neuro deficits noted. PSYCHIATRIC:  Normal affect    ASSESSMENT:    1. Chest pain of uncertain etiology   2. Shortness of breath   3. PVC (premature ventricular contraction)   4. Counseling on health promotion and disease prevention   5. Cardiac risk counseling    PLAN:    Chest pain Shortness of breath -reviewed recent ER visit (Care Everywhere), symptoms -discussed options for further evaluation. Given recent surgery, pneumonia, decision was to monitor symptoms and re-evaluate in 6-8 weeks -reviewed red flag warning signs that need immediate medical attention  Palpitations Occasional PVCs -symptoms improved -echo and echo stress unremarkable  Cardiac risk counseling and prevention recommendations: we will also discuss more in follow up -recommend heart healthy/Mediterranean diet, with whole grains, fruits, vegetable, fish, lean meats, nuts, and olive oil. Limit salt. -recommend moderate walking, 3-5 times/week for 30-50 minutes each session. Aim for at least 150 minutes.week. Goal should be pace of 3 miles/hours, or walking 1.5 miles in 30 minutes -recommend avoidance of tobacco products. Avoid excess alcohol. -ASCVD risk score: The 10-year ASCVD risk score (Arnett DK, et al., 2019) is: 4.7%   Values used to calculate the score:     Age: 69 years     Sex: Male     Is Non-Hispanic African American: No     Diabetic: No     Tobacco smoker: No     Systolic Blood Pressure: 118 mmHg     Is BP treated: Yes     HDL Cholesterol: 46 mg/dL     Total Cholesterol: 186 mg/dL    Plan for follow up: 6-8 weeks  Jodelle Red, MD, PhD, [facility]  [facility]    Medication Adjustments/Labs and Tests Ordered: Current medicines are reviewed at length  with the patient today.  Concerns regarding medicines are outlined above.  Orders Placed This Encounter  Procedures   EKG 12-Lead   No orders of the defined types were placed in this encounter.   Patient Instructions  Medication Instructions:  Your Physician recommend you continue on your current medication as directed.    *If you need a refill on your cardiac medications before your next appointment, please call your pharmacy*   Lab Work: None ordered today  Testing/Procedures: None ordered today    Follow-Up: At [facility], you and your health needs are our priority.  As part of our continuing mission to provide you with exceptional heart care, we have created designated Provider Care Teams.  These Care Teams include your primary Cardiologist (physician) and Advanced Practice Providers (APPs -  Physician Assistants and Nurse Practitioners) who all work together to provide you with the care you need, when you need it.  We recommend signing up for the patient portal called "MyChart".  Sign up information is provided on this After Visit Summary.  MyChart is used to connect with patients for Virtual Visits (Telemedicine).  Patients are able to view lab/test results, encounter notes, upcoming appointments, etc.  Non-urgent messages can be sent to your provider as well.   To learn more about what you can do with MyChart, go to [email or web address].    Your next appointment:   6-8 week(s)  The format for your next appointment:   In Person  Provider:   Jodelle Red, MD{  Other Instructions Exercise recommendations: The American Heart Association recommends 150 minutes of moderate intensity exercise weekly. Try 30 minutes of moderate intensity exercise 4-5 times per week. This could include walking, jogging, or swimming.     I,Mitra Faeizi,acting as a Neurosurgeon for [employer], MD.,have documented all relevant documentation on the behalf of Jodelle Red, MD,as directed by  Jodelle Red, MD while in the presence of Jodelle Red, MD.   I, Jodelle Red, MD, have reviewed all documentation for this visit. The documentation on 10/22/22 for the exam, diagnosis, procedures, and orders are all accurate and complete.   Signed, Jodelle Red, MD PhD  [facility]

## 2022-08-17 DIAGNOSIS — R7989 Other specified abnormal findings of blood chemistry: Secondary | ICD-10-CM | POA: Insufficient documentation

## 2022-08-18 ENCOUNTER — Ambulatory Visit: Payer: BC Managed Care – PPO | Admitting: Internal Medicine

## 2022-08-28 ENCOUNTER — Ambulatory Visit: Payer: BC Managed Care – PPO | Admitting: Sports Medicine

## 2022-09-08 DIAGNOSIS — R7989 Other specified abnormal findings of blood chemistry: Secondary | ICD-10-CM | POA: Diagnosis not present

## 2022-09-08 DIAGNOSIS — E063 Autoimmune thyroiditis: Secondary | ICD-10-CM | POA: Diagnosis not present

## 2022-09-08 DIAGNOSIS — E038 Other specified hypothyroidism: Secondary | ICD-10-CM | POA: Diagnosis not present

## 2022-10-22 ENCOUNTER — Encounter (HOSPITAL_BASED_OUTPATIENT_CLINIC_OR_DEPARTMENT_OTHER): Payer: Self-pay | Admitting: Cardiology

## 2022-10-30 ENCOUNTER — Other Ambulatory Visit: Payer: Self-pay | Admitting: Sports Medicine

## 2022-10-30 DIAGNOSIS — I10 Essential (primary) hypertension: Secondary | ICD-10-CM

## 2022-11-13 DIAGNOSIS — M25512 Pain in left shoulder: Secondary | ICD-10-CM | POA: Diagnosis not present

## 2022-11-13 DIAGNOSIS — M6281 Muscle weakness (generalized): Secondary | ICD-10-CM | POA: Diagnosis not present

## 2022-11-15 DIAGNOSIS — M6281 Muscle weakness (generalized): Secondary | ICD-10-CM | POA: Diagnosis not present

## 2022-11-15 DIAGNOSIS — M25512 Pain in left shoulder: Secondary | ICD-10-CM | POA: Diagnosis not present

## 2022-11-22 DIAGNOSIS — M25512 Pain in left shoulder: Secondary | ICD-10-CM | POA: Diagnosis not present

## 2022-11-22 DIAGNOSIS — M6281 Muscle weakness (generalized): Secondary | ICD-10-CM | POA: Diagnosis not present

## 2022-11-25 ENCOUNTER — Other Ambulatory Visit: Payer: Self-pay | Admitting: Sports Medicine

## 2022-11-27 DIAGNOSIS — M6281 Muscle weakness (generalized): Secondary | ICD-10-CM | POA: Diagnosis not present

## 2022-11-27 DIAGNOSIS — M25512 Pain in left shoulder: Secondary | ICD-10-CM | POA: Diagnosis not present

## 2022-12-05 ENCOUNTER — Encounter: Payer: Self-pay | Admitting: Family Medicine

## 2022-12-05 ENCOUNTER — Ambulatory Visit: Payer: BC Managed Care – PPO | Admitting: Family Medicine

## 2022-12-05 VITALS — BP 142/88 | HR 77 | Ht 72.0 in | Wt 220.0 lb

## 2022-12-05 DIAGNOSIS — N419 Inflammatory disease of prostate, unspecified: Secondary | ICD-10-CM

## 2022-12-05 DIAGNOSIS — J019 Acute sinusitis, unspecified: Secondary | ICD-10-CM | POA: Diagnosis not present

## 2022-12-05 DIAGNOSIS — B9689 Other specified bacterial agents as the cause of diseases classified elsewhere: Secondary | ICD-10-CM | POA: Diagnosis not present

## 2022-12-05 DIAGNOSIS — R319 Hematuria, unspecified: Secondary | ICD-10-CM | POA: Diagnosis not present

## 2022-12-05 LAB — POCT URINALYSIS DIP (CLINITEK)
Bilirubin, UA: NEGATIVE
Glucose, UA: NEGATIVE mg/dL
Ketones, POC UA: NEGATIVE mg/dL
Nitrite, UA: NEGATIVE
POC PROTEIN,UA: NEGATIVE
Spec Grav, UA: 1.01 (ref 1.010–1.025)
Urobilinogen, UA: 0.2 E.U./dL
pH, UA: 6.5 (ref 5.0–8.0)

## 2022-12-05 MED ORDER — DOXYCYCLINE HYCLATE 100 MG PO TABS: 100.0000 mg | ORAL_TABLET | Freq: Two times a day (BID) | ORAL | 0 refills | Status: AC

## 2022-12-05 NOTE — Progress Notes (Signed)
Casey Elliott - 57 y.o. male MRN [id number]  Date of birth: Feb 08, 1966  Subjective Chief Complaint  Patient presents with   Hematuria    HPI Casey Elliott is a 57 year old male here today with complaint of hematuria.  He reports that he noticed blood in his urine over the past day or 2.  Additionally has had pressure in his perineum and groin area.  Notices some urinary frequency as well as slight discharge at times.  He has history of prostatitis.  Symptoms are similar to his previous episodes.  Denies flank pain.  He has not had fever or chills.  No STI exposure.\  He has a history of a sinus infection.  He has had increased sinus pain and pressure over the past few weeks.  Initially has what sounds like was URI.  The symptoms improved however developed the symptoms shortly afterwards.  He has not tried anything so far symptoms.  Denies lower respiratory symptoms.    ROS:  A comprehensive ROS was completed and negative except as noted per HPI  Allergies  Allergen Reactions   Augmentin [Amoxicillin-Pot Clavulanate]     rash   Ciprofloxacin Other (See Comments)    Body aches   Levofloxacin     Tendon Rupture   Meperidine     Blood pressure drop   Meperidine Hcl     Past Medical History:  Diagnosis Date   Chronic prostatitis 08/04/2014   Hypertriglyceridemia 08/04/2014   Hypogonadism in male 08/04/2014   Hypothyroidism 08/04/2014    Past Surgical History:  Procedure Laterality Date   APPENDECTOMY     SHOULDER ARTHROSCOPY     TENDON REPAIR      Social History   Socioeconomic History   Marital status: Married    Spouse name: Not on file   Number of children: Not on file   Years of education: Not on file   Highest education level: Not on file  Occupational History   Not on file  Tobacco Use   Smoking status: Never   Smokeless tobacco: Never  Substance and Sexual Activity   Alcohol use: Not on file   Drug use: Not on file   Sexual activity: Not on file   Other Topics Concern   Not on file  Social History Narrative   Not on file   Social Determinants of Health   Financial Resource Strain: Not on file  Food Insecurity: Not on file  Transportation Needs: Not on file  Physical Activity: Not on file  Stress: Not on file  Social Connections: Not on file    Family History  Problem Relation Age of Onset   Other Neg Hx        hypogonadism    Health Maintenance  Topic Date Due   INFLUENZA VACCINE  12/17/2022 (Originally 04/18/2022)   Zoster Vaccines- Shingrix (1 of 2) 06/06/2024 (Originally 05/21/2018)   Hepatitis C Screening  07/13/2069 (Originally 05/21/1986)   COLONOSCOPY (Pts 45-48yrs Insurance coverage will need to be confirmed)  10/31/2023   DTaP/Tdap/Td (3 - Td or Tdap) 01/04/2030   HIV Screening  Completed   HPV VACCINES  Aged Out   COVID-19 Vaccine  Discontinued     ----------------------------------------------------------------------------------------------------------------------------------------------------------------------------------------------------------------- Physical Exam BP (!) 142/88 (BP Location: Left Arm, Patient Position: Sitting, Cuff Size: Large)   Pulse 77   Ht 6' (1.829 m)   Wt 220 lb (99.8 kg)   SpO2 100%   BMI 29.84 kg/m   Physical Exam Constitutional:  Appearance: Normal appearance.  HENT:     Head: Normocephalic and atraumatic.  Eyes:     General: No scleral icterus. Cardiovascular:     Rate and Rhythm: Normal rate and regular rhythm.  Pulmonary:     Effort: Pulmonary effort is normal.     Breath sounds: Normal breath sounds.  Musculoskeletal:     Cervical back: Neck supple.  Neurological:     Mental Status: He is alert.  Psychiatric:        Mood and Affect: Mood normal.        Behavior: Behavior normal.      ------------------------------------------------------------------------------------------------------------------------------------------------------------------------------------------------------------------- Assessment and Plan  Acute bacterial sinusitis Similar to previous sinus infections.  He did have URI second sickening.  Treated with course of doxycycline.  Recommend increase fluids.  Prostatitis He has history of prostatitis with symptoms that are consistent with this again.  Urinalysis with small blood and leukocytes.  Sent for culture.  Checking PSA will refer to urology given recurrent nature.  Treated with episode with doxycycline as he had rash with Bactrim previously and tendon rupture while taking fluoroquinolone.   Meds ordered this encounter  Medications   doxycycline (VIBRA-TABS) 100 MG tablet    Sig: Take 1 tablet (100 mg total) by mouth 2 (two) times daily for 14 days.    Dispense:  28 tablet    Refill:  0    No follow-ups on file.    This visit occurred during the SARS-CoV-2 public health emergency.  Safety protocols were in place, including screening questions prior to the visit, additional usage of staff PPE, and extensive cleaning of exam room while observing appropriate contact time as indicated for disinfecting solutions.

## 2022-12-05 NOTE — Assessment & Plan Note (Signed)
He has history of prostatitis with symptoms that are consistent with this again.  Urinalysis with small blood and leukocytes.  Sent for culture.  Checking PSA will refer to urology given recurrent nature.  Treated with episode with doxycycline as he had rash with Bactrim previously and tendon rupture while taking fluoroquinolone.

## 2022-12-05 NOTE — Assessment & Plan Note (Signed)
Similar to previous sinus infections.  He did have URI second sickening.  Treated with course of doxycycline.  Recommend increase fluids.

## 2022-12-05 NOTE — Patient Instructions (Signed)
Start doxycycline.  This should cover for both the sinus infection and prostate.  We'll be in touch with lab/culture results.

## 2022-12-06 DIAGNOSIS — M6281 Muscle weakness (generalized): Secondary | ICD-10-CM | POA: Diagnosis not present

## 2022-12-06 DIAGNOSIS — M25512 Pain in left shoulder: Secondary | ICD-10-CM | POA: Diagnosis not present

## 2022-12-07 ENCOUNTER — Other Ambulatory Visit: Payer: Self-pay | Admitting: Family Medicine

## 2022-12-07 LAB — PSA, TOTAL AND FREE
PSA, % Free: 18 % (calc) — ABNORMAL LOW (ref >25)
PSA, Free: 0.7 ng/mL
PSA, Total: 3.9 ng/mL (ref <=4.0)

## 2022-12-07 LAB — URINE CULTURE
MICRO NUMBER:: 14711740
SPECIMEN QUALITY:: ADEQUATE

## 2022-12-07 MED ORDER — CEPHALEXIN 500 MG PO CAPS: 500.0000 mg | ORAL_CAPSULE | Freq: Four times a day (QID) | ORAL | 0 refills | Status: AC

## 2022-12-08 ENCOUNTER — Ambulatory Visit: Payer: BC Managed Care – PPO | Admitting: Sports Medicine

## 2022-12-10 DIAGNOSIS — K219 Gastro-esophageal reflux disease without esophagitis: Secondary | ICD-10-CM | POA: Diagnosis not present

## 2022-12-10 DIAGNOSIS — Z79890 Hormone replacement therapy (postmenopausal): Secondary | ICD-10-CM | POA: Diagnosis not present

## 2022-12-10 DIAGNOSIS — Z881 Allergy status to other antibiotic agents status: Secondary | ICD-10-CM | POA: Diagnosis not present

## 2022-12-10 DIAGNOSIS — Z888 Allergy status to other drugs, medicaments and biological substances status: Secondary | ICD-10-CM | POA: Diagnosis not present

## 2022-12-10 DIAGNOSIS — Z8659 Personal history of other mental and behavioral disorders: Secondary | ICD-10-CM | POA: Diagnosis not present

## 2022-12-10 DIAGNOSIS — R079 Chest pain, unspecified: Secondary | ICD-10-CM | POA: Diagnosis not present

## 2022-12-10 DIAGNOSIS — R9431 Abnormal electrocardiogram [ECG] [EKG]: Secondary | ICD-10-CM | POA: Diagnosis not present

## 2022-12-10 DIAGNOSIS — R7989 Other specified abnormal findings of blood chemistry: Secondary | ICD-10-CM | POA: Diagnosis not present

## 2022-12-10 DIAGNOSIS — Z880 Allergy status to penicillin: Secondary | ICD-10-CM | POA: Diagnosis not present

## 2022-12-10 DIAGNOSIS — Z79899 Other long term (current) drug therapy: Secondary | ICD-10-CM | POA: Diagnosis not present

## 2022-12-10 DIAGNOSIS — R072 Precordial pain: Secondary | ICD-10-CM | POA: Diagnosis not present

## 2022-12-10 DIAGNOSIS — Z7951 Long term (current) use of inhaled steroids: Secondary | ICD-10-CM | POA: Diagnosis not present

## 2022-12-11 DIAGNOSIS — M6281 Muscle weakness (generalized): Secondary | ICD-10-CM | POA: Diagnosis not present

## 2022-12-11 DIAGNOSIS — M25512 Pain in left shoulder: Secondary | ICD-10-CM | POA: Diagnosis not present

## 2022-12-20 DIAGNOSIS — M6281 Muscle weakness (generalized): Secondary | ICD-10-CM | POA: Diagnosis not present

## 2022-12-20 DIAGNOSIS — M25512 Pain in left shoulder: Secondary | ICD-10-CM | POA: Diagnosis not present

## 2022-12-22 DIAGNOSIS — Z1330 Encounter for screening examination for mental health and behavioral disorders, unspecified: Secondary | ICD-10-CM | POA: Diagnosis not present

## 2022-12-22 DIAGNOSIS — E069 Thyroiditis, unspecified: Secondary | ICD-10-CM | POA: Diagnosis not present

## 2022-12-22 DIAGNOSIS — R7989 Other specified abnormal findings of blood chemistry: Secondary | ICD-10-CM | POA: Diagnosis not present

## 2022-12-27 DIAGNOSIS — E291 Testicular hypofunction: Secondary | ICD-10-CM | POA: Diagnosis not present

## 2022-12-27 DIAGNOSIS — E038 Other specified hypothyroidism: Secondary | ICD-10-CM | POA: Diagnosis not present

## 2022-12-27 DIAGNOSIS — R7989 Other specified abnormal findings of blood chemistry: Secondary | ICD-10-CM | POA: Diagnosis not present

## 2022-12-27 DIAGNOSIS — E069 Thyroiditis, unspecified: Secondary | ICD-10-CM | POA: Diagnosis not present

## 2023-01-04 DIAGNOSIS — M6281 Muscle weakness (generalized): Secondary | ICD-10-CM | POA: Diagnosis not present

## 2023-01-04 DIAGNOSIS — M25512 Pain in left shoulder: Secondary | ICD-10-CM | POA: Diagnosis not present

## 2023-01-08 DIAGNOSIS — M25512 Pain in left shoulder: Secondary | ICD-10-CM | POA: Diagnosis not present

## 2023-01-08 DIAGNOSIS — M6281 Muscle weakness (generalized): Secondary | ICD-10-CM | POA: Diagnosis not present

## 2023-01-17 DIAGNOSIS — M6281 Muscle weakness (generalized): Secondary | ICD-10-CM | POA: Diagnosis not present

## 2023-01-17 DIAGNOSIS — M25512 Pain in left shoulder: Secondary | ICD-10-CM | POA: Diagnosis not present

## 2023-01-22 DIAGNOSIS — M6281 Muscle weakness (generalized): Secondary | ICD-10-CM | POA: Diagnosis not present

## 2023-01-22 DIAGNOSIS — M25512 Pain in left shoulder: Secondary | ICD-10-CM | POA: Diagnosis not present

## 2023-01-24 ENCOUNTER — Other Ambulatory Visit: Payer: Self-pay | Admitting: Sports Medicine

## 2023-01-24 DIAGNOSIS — I10 Essential (primary) hypertension: Secondary | ICD-10-CM

## 2023-01-31 DIAGNOSIS — M25512 Pain in left shoulder: Secondary | ICD-10-CM | POA: Diagnosis not present

## 2023-01-31 DIAGNOSIS — M6281 Muscle weakness (generalized): Secondary | ICD-10-CM | POA: Diagnosis not present

## 2023-02-05 DIAGNOSIS — M25512 Pain in left shoulder: Secondary | ICD-10-CM | POA: Diagnosis not present

## 2023-02-05 DIAGNOSIS — M6281 Muscle weakness (generalized): Secondary | ICD-10-CM | POA: Diagnosis not present

## 2023-02-14 DIAGNOSIS — M25512 Pain in left shoulder: Secondary | ICD-10-CM | POA: Diagnosis not present

## 2023-02-14 DIAGNOSIS — M6281 Muscle weakness (generalized): Secondary | ICD-10-CM | POA: Diagnosis not present

## 2023-02-19 DIAGNOSIS — M6281 Muscle weakness (generalized): Secondary | ICD-10-CM | POA: Diagnosis not present

## 2023-02-19 DIAGNOSIS — M25512 Pain in left shoulder: Secondary | ICD-10-CM | POA: Diagnosis not present

## 2023-02-21 ENCOUNTER — Other Ambulatory Visit: Payer: Self-pay | Admitting: Sports Medicine

## 2023-02-26 DIAGNOSIS — M25512 Pain in left shoulder: Secondary | ICD-10-CM | POA: Diagnosis not present

## 2023-02-26 DIAGNOSIS — M6281 Muscle weakness (generalized): Secondary | ICD-10-CM | POA: Diagnosis not present

## 2023-03-07 DIAGNOSIS — M25512 Pain in left shoulder: Secondary | ICD-10-CM | POA: Diagnosis not present

## 2023-03-07 DIAGNOSIS — M6281 Muscle weakness (generalized): Secondary | ICD-10-CM | POA: Diagnosis not present

## 2023-03-08 ENCOUNTER — Ambulatory Visit: Payer: BC Managed Care – PPO | Admitting: Sports Medicine

## 2023-03-08 DIAGNOSIS — M67432 Ganglion, left wrist: Secondary | ICD-10-CM | POA: Diagnosis not present

## 2023-03-08 NOTE — Assessment & Plan Note (Signed)
Pleasant 57 year old male, he has noted a couple weeks of a lump in the dorsal wrist proximal to the radiocarpal joint. On exam he has a palpable swelling that feels to be in association with the extensor tendon compartments. We did an unofficial ultrasound that did show an obvious ganglion cyst that appeared to be associated with the fourth and fifth extensor compartments, as this is for the most part asymptomatic he will treat this conservatively with relative rest, cryotherapy 20 minutes 3 times daily, he can return to see me for aspiration and injection in the future but only if absolutely necessary.

## 2023-03-08 NOTE — Progress Notes (Signed)
    Procedures performed today:    None.  Independent interpretation of notes and tests performed by another provider:   None.  Brief History, Exam, Impression, and Recommendations:    Ganglion cyst of wrist, left Pleasant 57 year old male, he has noted a couple weeks of a lump in the dorsal wrist proximal to the radiocarpal joint. On exam he has a palpable swelling that feels to be in association with the extensor tendon compartments. We did an unofficial ultrasound that did show an obvious ganglion cyst that appeared to be associated with the fourth and fifth extensor compartments, as this is for the most part asymptomatic he will treat this conservatively with relative rest, cryotherapy 20 minutes 3 times daily, he can return to see me for aspiration and injection in the future but only if absolutely necessary.    ____________________________________________ Ihor Austin. Benjamin Stain, M.D., ABFM., CAQSM., AME. Primary Care and Sports Medicine [facility]  Adjunct Professor of Family Medicine  [employer]  Restaurant manager, fast food

## 2023-03-23 DIAGNOSIS — M6281 Muscle weakness (generalized): Secondary | ICD-10-CM | POA: Diagnosis not present

## 2023-03-23 DIAGNOSIS — M25512 Pain in left shoulder: Secondary | ICD-10-CM | POA: Diagnosis not present

## 2023-04-09 DIAGNOSIS — R197 Diarrhea, unspecified: Secondary | ICD-10-CM | POA: Diagnosis not present

## 2023-04-09 DIAGNOSIS — R1013 Epigastric pain: Secondary | ICD-10-CM | POA: Diagnosis not present

## 2023-04-09 DIAGNOSIS — R1010 Upper abdominal pain, unspecified: Secondary | ICD-10-CM | POA: Diagnosis not present

## 2023-04-09 DIAGNOSIS — K219 Gastro-esophageal reflux disease without esophagitis: Secondary | ICD-10-CM | POA: Diagnosis not present

## 2023-04-23 DIAGNOSIS — Z96612 Presence of left artificial shoulder joint: Secondary | ICD-10-CM | POA: Diagnosis not present

## 2023-04-24 ENCOUNTER — Other Ambulatory Visit: Payer: Self-pay | Admitting: Sports Medicine

## 2023-04-24 DIAGNOSIS — F5101 Primary insomnia: Secondary | ICD-10-CM

## 2023-04-25 ENCOUNTER — Ambulatory Visit: Payer: BC Managed Care – PPO | Admitting: Family Medicine

## 2023-04-25 ENCOUNTER — Encounter: Payer: Self-pay | Admitting: Family Medicine

## 2023-04-25 VITALS — BP 116/79 | HR 74 | Resp 16 | Ht 72.0 in | Wt 213.2 lb

## 2023-04-25 DIAGNOSIS — L03213 Periorbital cellulitis: Secondary | ICD-10-CM

## 2023-04-25 MED ORDER — SULFAMETHOXAZOLE-TRIMETHOPRIM 800-160 MG PO TABS: 1.0000 | ORAL_TABLET | Freq: Two times a day (BID) | ORAL | 0 refills | Status: AC

## 2023-04-25 NOTE — Progress Notes (Signed)
Acute patient visit   Patient: Casey Elliott   DOB: 1966-07-02   57 y.o. Male  MRN: [id number] Visit Date: 04/25/2023  Today's healthcare provider: Charlton Amor, DO   Chief Complaint  Patient presents with   Belepharitis    Pt comes in with red swollen eyelid on left side x3days    SUBJECTIVE    Chief Complaint  Patient presents with   Belepharitis    Pt comes in with red swollen eyelid on left side x3days   HPI  Pt presents with eye swelling of his left eye.  He tried hot compresses.  He says he does have a sore lesion above his left eye.  He says he had some discharge draining from it as well.  It has been going on for about 3 days.  He does note a similar episode that happened many years ago.  Review of Systems  Constitutional:  Negative for activity change, fatigue and fever.  HENT:         Swelling of eye  Respiratory:  Negative for cough and shortness of breath.   Cardiovascular:  Negative for chest pain.  Gastrointestinal:  Negative for abdominal pain.  Genitourinary:  Negative for difficulty urinating.       Current Meds  Medication Sig   ALPRAZolam (XANAX) 0.5 MG tablet TAKE 0.5-1 TABLETS (0.25-0.5 MG TOTAL) BY MOUTH AT BEDTIME AS NEEDED FOR ANXIETY.   chlorthalidone (HYGROTON) 25 MG tablet TAKE 1/2 TABLET BY MOUTH EVERY MORNING   cyclobenzaprine (FLEXERIL) 10 MG tablet 3 (three) times daily as needed for muscle spasms.   fenofibrate 160 MG tablet Take 1 tablet (160 mg total) by mouth daily.   levothyroxine (SYNTHROID) 25 MCG tablet Take 1 tablet (25 mcg total) by mouth daily.   lisinopril (ZESTRIL) 10 MG tablet TAKE 1 TABLET BY MOUTH EVERY DAY   NAPROSYN 500 MG tablet Take 500 mg by mouth 2 (two) times daily with a meal.   pantoprazole (PROTONIX) 40 MG tablet TAKE ONE TABLET BY MOUTH 2 TIMES DAILY.   sulfamethoxazole-trimethoprim (BACTRIM DS) 800-160 MG tablet Take 1 tablet by mouth 2 (two) times daily for 5 days.   tadalafil (CIALIS) 5 MG tablet Take  1 tablet (5 mg total) by mouth daily.    OBJECTIVE    BP 116/79 (BP Location: Left Arm, Patient Position: Sitting, Cuff Size: Large)   Pulse 74   Resp 16   Ht 6' (1.829 m)   Wt 213 lb 4 oz (96.7 kg)   SpO2 100%   BMI 28.92 kg/m   Physical Exam Vitals and nursing note reviewed.  Constitutional:      General: He is not in acute distress.    Appearance: Normal appearance.  HENT:     Head: Normocephalic and atraumatic.     Right Ear: External ear normal.     Left Ear: External ear normal.     Nose: Nose normal.  Eyes:     Conjunctiva/sclera: Conjunctivae normal.     Comments: Left upper eyelid swelling with hordeulum present  Cardiovascular:     Rate and Rhythm: Normal rate and regular rhythm.  Pulmonary:     Effort: Pulmonary effort is normal.     Breath sounds: Normal breath sounds.  Neurological:     General: No focal deficit present.     Mental Status: He is alert and oriented to person, place, and time.  Psychiatric:        Mood and Affect:  Mood normal.        Behavior: Behavior normal.        Thought Content: Thought content normal.        Judgment: Judgment normal.        ASSESSMENT & PLAN    Problem List Items Addressed This Visit       Other   Preseptal cellulitis of left eye - Primary    Patient presents with left upper eyelid swelling.  It is red on exam.  He does note a hard knot that had some drainage.  Due to this I will go ahead and treat for preseptal cellulitis.  This is likely secondary to a hordeolum/stye.  I did ask patient if he has been under a lot more stress recently and he denies this.  Told patient to follow-up if he is no better in 1 week.  Will go ahead and give Bactrim and recommend warm compresses.      Relevant Medications   sulfamethoxazole-trimethoprim (BACTRIM DS) 800-160 MG tablet    Return if symptoms worsen or fail to improve.      Meds ordered this encounter  Medications   sulfamethoxazole-trimethoprim (BACTRIM DS)  800-160 MG tablet    Sig: Take 1 tablet by mouth 2 (two) times daily for 5 days.    Dispense:  10 tablet    Refill:  0    No orders of the defined types were placed in this encounter.    Charlton Amor, DO  [facility] at [facility] [phone] (phone) [phone] (fax)  [facility]

## 2023-04-25 NOTE — Assessment & Plan Note (Signed)
Patient presents with left upper eyelid swelling.  It is red on exam.  He does note a hard knot that had some drainage.  Due to this I will go ahead and treat for preseptal cellulitis.  This is likely secondary to a hordeolum/stye.  I did ask patient if he has been under a lot more stress recently and he denies this.  Told patient to follow-up if he is no better in 1 week.  Will go ahead and give Bactrim and recommend warm compresses.

## 2023-04-25 NOTE — Patient Instructions (Signed)
Keep using the warm, moist compresses placed on the affected areas frequently (eg, for 5 to 10 minutes three to five times per day) to facilitate drainage.   Massage and gentle wiping of the affected eyelid after the warm compress can also aid in drainage.

## 2023-04-26 DIAGNOSIS — K222 Esophageal obstruction: Secondary | ICD-10-CM | POA: Diagnosis not present

## 2023-04-26 DIAGNOSIS — K219 Gastro-esophageal reflux disease without esophagitis: Secondary | ICD-10-CM | POA: Diagnosis not present

## 2023-04-26 DIAGNOSIS — K449 Diaphragmatic hernia without obstruction or gangrene: Secondary | ICD-10-CM | POA: Diagnosis not present

## 2023-05-11 DIAGNOSIS — R7989 Other specified abnormal findings of blood chemistry: Secondary | ICD-10-CM | POA: Diagnosis not present

## 2023-05-11 DIAGNOSIS — E069 Thyroiditis, unspecified: Secondary | ICD-10-CM | POA: Diagnosis not present

## 2023-05-16 DIAGNOSIS — R7989 Other specified abnormal findings of blood chemistry: Secondary | ICD-10-CM | POA: Diagnosis not present

## 2023-05-16 DIAGNOSIS — E069 Thyroiditis, unspecified: Secondary | ICD-10-CM | POA: Diagnosis not present

## 2023-07-20 ENCOUNTER — Other Ambulatory Visit: Payer: Self-pay | Admitting: Sports Medicine

## 2023-07-20 DIAGNOSIS — I10 Essential (primary) hypertension: Secondary | ICD-10-CM

## 2023-08-08 DIAGNOSIS — Z96612 Presence of left artificial shoulder joint: Secondary | ICD-10-CM | POA: Diagnosis not present

## 2023-08-11 DIAGNOSIS — R319 Hematuria, unspecified: Secondary | ICD-10-CM | POA: Diagnosis not present

## 2023-08-11 DIAGNOSIS — N3001 Acute cystitis with hematuria: Secondary | ICD-10-CM | POA: Diagnosis not present

## 2023-08-11 DIAGNOSIS — K5730 Diverticulosis of large intestine without perforation or abscess without bleeding: Secondary | ICD-10-CM | POA: Diagnosis not present

## 2023-08-11 DIAGNOSIS — Z87438 Personal history of other diseases of male genital organs: Secondary | ICD-10-CM | POA: Diagnosis not present

## 2023-08-11 DIAGNOSIS — R10819 Abdominal tenderness, unspecified site: Secondary | ICD-10-CM | POA: Diagnosis not present

## 2023-08-11 DIAGNOSIS — R300 Dysuria: Secondary | ICD-10-CM | POA: Diagnosis not present

## 2023-08-21 ENCOUNTER — Other Ambulatory Visit: Payer: Self-pay | Admitting: Sports Medicine

## 2023-08-28 DIAGNOSIS — N400 Enlarged prostate without lower urinary tract symptoms: Secondary | ICD-10-CM | POA: Diagnosis not present

## 2023-08-28 DIAGNOSIS — R3129 Other microscopic hematuria: Secondary | ICD-10-CM | POA: Diagnosis not present

## 2023-09-18 ENCOUNTER — Ambulatory Visit: Payer: BC Managed Care – PPO | Admitting: Sports Medicine

## 2023-09-18 ENCOUNTER — Encounter: Payer: Self-pay | Admitting: Sports Medicine

## 2023-09-18 ENCOUNTER — Ambulatory Visit: Payer: BC Managed Care – PPO

## 2023-09-18 DIAGNOSIS — M25532 Pain in left wrist: Secondary | ICD-10-CM

## 2023-09-18 DIAGNOSIS — M4802 Spinal stenosis, cervical region: Secondary | ICD-10-CM | POA: Diagnosis not present

## 2023-09-18 DIAGNOSIS — M47812 Spondylosis without myelopathy or radiculopathy, cervical region: Secondary | ICD-10-CM | POA: Diagnosis not present

## 2023-09-18 DIAGNOSIS — M1611 Unilateral primary osteoarthritis, right hip: Secondary | ICD-10-CM

## 2023-09-18 DIAGNOSIS — M160 Bilateral primary osteoarthritis of hip: Secondary | ICD-10-CM | POA: Diagnosis not present

## 2023-09-18 DIAGNOSIS — M25551 Pain in right hip: Secondary | ICD-10-CM | POA: Diagnosis not present

## 2023-09-18 NOTE — Progress Notes (Signed)
    Procedures performed today:    None.  Independent interpretation of notes and tests performed by another provider:   None.  Brief History, Exam, Impression, and Recommendations:    Right hip trochanteric bursitis and mild osteoarthritis Very pleasant 57 year old male, he is having increasing pain right hip lateral aspect. On exam he has good internal rotation without pain, he does have tenderness over the greater trochanter with profound weakness of the hip abductors. Hip abductors were strong on the left. Of note he did have an MRI right hip in the past that did show very mild osteoarthritis. I explained the anatomy and pathophysiology, he will work aggressively on hip abductor conditioning, we will do x-rays, and if insufficient improvement in pain but good improvements in strength at the follow-up we will proceed with injection.  Cervical spondylosis Known cervical spondylosis, intermittent suboccipital pain bilaterally with tenderness to palpation. Nothing radicular. Moderate crepitus. Explained the anatomy and pathophysiology. He will do home PT, x-rays, we showed him how to do home traction, follow this up in 6 weeks.  Left wrist pain Several weeks of increasing pain left wrist with mechanical symptoms. On exam he has tenderness over the radiocarpal joint, TFCC, he does have a positive Watson's test, he has a positive grind test with ulnar deviation. I am concerned for scapholunate ligamentous disruption and TFCC tearing. Proceeding with x-rays, wrist arthrography, we will see him back for the arthrogram injection, in the meantime he will wear a wrist brace that he already has at home and do some wrist PT at home.    ____________________________________________ Debby PARAS. Curtis, M.D., ABFM., CAQSM., AME. Primary Care and Sports Medicine [facility]  Adjunct Professor of [facility] Medicine  [employer]  Restaurant Manager, [employer]

## 2023-09-18 NOTE — Assessment & Plan Note (Signed)
 Very pleasant 58 year old male, he is having increasing pain right hip lateral aspect. On exam he has good internal rotation without pain, he does have tenderness over the greater trochanter with profound weakness of the hip abductors. Hip abductors were strong on the left. Of note he did have an MRI right hip in the past that did show very mild osteoarthritis. I explained the anatomy and pathophysiology, he will work aggressively on hip abductor conditioning, we will do x-rays, and if insufficient improvement in pain but good improvements in strength at the follow-up we will proceed with injection.

## 2023-09-18 NOTE — Assessment & Plan Note (Signed)
 Several weeks of increasing pain left wrist with mechanical symptoms. On exam he has tenderness over the radiocarpal joint, TFCC, he does have a positive Watson's test, he has a positive grind test with ulnar deviation. I am concerned for scapholunate ligamentous disruption and TFCC tearing. Proceeding with x-rays, wrist arthrography, we will see him back for the arthrogram injection, in the meantime he will wear a wrist brace that he already has at home and do some wrist PT at home.

## 2023-09-18 NOTE — Assessment & Plan Note (Signed)
 Known cervical spondylosis, intermittent suboccipital pain bilaterally with tenderness to palpation. Nothing radicular. Moderate crepitus. Explained the anatomy and pathophysiology. He will do home PT, x-rays, we showed him how to do home traction, follow this up in 6 weeks.

## 2023-10-08 ENCOUNTER — Ambulatory Visit (INDEPENDENT_AMBULATORY_CARE_PROVIDER_SITE_OTHER): Payer: BC Managed Care – PPO

## 2023-10-08 ENCOUNTER — Encounter: Payer: Self-pay | Admitting: Sports Medicine

## 2023-10-08 ENCOUNTER — Ambulatory Visit: Payer: BC Managed Care – PPO

## 2023-10-08 ENCOUNTER — Ambulatory Visit: Payer: BC Managed Care – PPO | Admitting: Sports Medicine

## 2023-10-08 DIAGNOSIS — M25532 Pain in left wrist: Secondary | ICD-10-CM

## 2023-10-08 DIAGNOSIS — S63592A Other specified sprain of left wrist, initial encounter: Secondary | ICD-10-CM | POA: Diagnosis not present

## 2023-10-08 DIAGNOSIS — M19032 Primary osteoarthritis, left wrist: Secondary | ICD-10-CM | POA: Diagnosis not present

## 2023-10-08 MED ORDER — GADOBUTROL 1 MMOL/ML IV SOLN
1.0000 mL | Freq: Once | INTRAVENOUS | Status: AC | PRN
  Administered 2023-10-08: 1 mL

## 2023-10-08 NOTE — Progress Notes (Signed)
    Procedures performed today:    Procedure: Real-time Ultrasound Guided gadolinium contrast injection of left radiocarpal joint Device: Samsung HS60  Verbal informed consent obtained.  Time-out conducted.  Noted no overlying erythema, induration, or other signs of local infection.  Skin prepped in a sterile fashion.  Local anesthesia: Topical Ethyl chloride.  With sterile technique and under real time ultrasound guidance: Trace antibodies noted, I advanced the 25-gauge needle into the radiocarpal joint, injected 1 cc lidocaine, 1 cc kenalog 40, syringe switched and 0.05 cc gadolinium injected, syringe again switched and 1 cc sterile saline used to distend the joint. Joint visualized and capsule seen distending confirming intra-articular placement of contrast material and medication. Completed without difficulty  Advised to call if fevers/chills, erythema, induration, drainage, or persistent bleeding.  Images permanently stored in PACS Impression: Technically successful ultrasound guided gadolinium contrast injection for MR arthrography.  Please see separate MR arthrogram report.   Independent interpretation of notes and tests performed by another provider:   None.  Brief History, Exam, Impression, and Recommendations:    Left wrist pain Several weeks of increasing pain left wrist with mechanical symptoms. On exam he has tenderness over the radiocarpal joint, TFCC, he does have a positive Watson's test, he has a positive grind test with ulnar deviation. I am concerned for scapholunate ligamentous disruption and TFCC tearing. Proceeding with x-rays, wrist arthrography, we will see him back for the arthrogram injection, in the meantime he will wear a wrist brace that he already has at home and do some wrist PT at home.  Update: October 08, 2023 injection performed today for MR arthrography.    ____________________________________________ Ihor Austin. Benjamin Stain, M.D., ABFM., CAQSM.,  AME. Primary Care and Sports Medicine [facility]  Adjunct Professor of Family Medicine  [place] [employer] [facility] [employer]  Restaurant manager, fast food

## 2023-10-08 NOTE — Assessment & Plan Note (Signed)
Several weeks of increasing pain left wrist with mechanical symptoms. On exam he has tenderness over the radiocarpal joint, TFCC, he does have a positive Watson's test, he has a positive grind test with ulnar deviation. I am concerned for scapholunate ligamentous disruption and TFCC tearing. Proceeding with x-rays, wrist arthrography, we will see him back for the arthrogram injection, in the meantime he will wear a wrist brace that he already has at home and do some wrist PT at home.  Update: October 08, 2023 injection performed today for MR arthrography.

## 2023-10-15 ENCOUNTER — Encounter: Payer: Self-pay | Admitting: Sports Medicine

## 2023-10-15 DIAGNOSIS — M25532 Pain in left wrist: Secondary | ICD-10-CM

## 2023-11-12 DIAGNOSIS — E069 Thyroiditis, unspecified: Secondary | ICD-10-CM | POA: Diagnosis not present

## 2023-11-12 DIAGNOSIS — Z1330 Encounter for screening examination for mental health and behavioral disorders, unspecified: Secondary | ICD-10-CM | POA: Diagnosis not present

## 2023-11-12 DIAGNOSIS — R7989 Other specified abnormal findings of blood chemistry: Secondary | ICD-10-CM | POA: Diagnosis not present

## 2023-11-16 DIAGNOSIS — E069 Thyroiditis, unspecified: Secondary | ICD-10-CM | POA: Diagnosis not present

## 2023-11-16 DIAGNOSIS — R7989 Other specified abnormal findings of blood chemistry: Secondary | ICD-10-CM | POA: Diagnosis not present

## 2023-11-16 DIAGNOSIS — E291 Testicular hypofunction: Secondary | ICD-10-CM | POA: Diagnosis not present

## 2023-12-10 DIAGNOSIS — M13832 Other specified arthritis, left wrist: Secondary | ICD-10-CM | POA: Diagnosis not present

## 2023-12-10 DIAGNOSIS — M24132 Other articular cartilage disorders, left wrist: Secondary | ICD-10-CM | POA: Diagnosis not present

## 2024-01-17 ENCOUNTER — Other Ambulatory Visit: Payer: Self-pay | Admitting: Sports Medicine

## 2024-01-17 DIAGNOSIS — I10 Essential (primary) hypertension: Secondary | ICD-10-CM

## 2024-02-08 ENCOUNTER — Ambulatory Visit: Admitting: Sports Medicine

## 2024-02-08 ENCOUNTER — Ambulatory Visit: Payer: Self-pay | Admitting: Sports Medicine

## 2024-02-08 ENCOUNTER — Ambulatory Visit (INDEPENDENT_AMBULATORY_CARE_PROVIDER_SITE_OTHER)

## 2024-02-08 VITALS — BP 129/76 | HR 83 | Wt 219.0 lb

## 2024-02-08 DIAGNOSIS — N281 Cyst of kidney, acquired: Secondary | ICD-10-CM | POA: Diagnosis not present

## 2024-02-08 DIAGNOSIS — R1010 Upper abdominal pain, unspecified: Secondary | ICD-10-CM | POA: Diagnosis not present

## 2024-02-08 DIAGNOSIS — B9689 Other specified bacterial agents as the cause of diseases classified elsewhere: Secondary | ICD-10-CM

## 2024-02-08 DIAGNOSIS — L989 Disorder of the skin and subcutaneous tissue, unspecified: Secondary | ICD-10-CM | POA: Diagnosis not present

## 2024-02-08 DIAGNOSIS — J019 Acute sinusitis, unspecified: Secondary | ICD-10-CM | POA: Diagnosis not present

## 2024-02-08 DIAGNOSIS — I10 Essential (primary) hypertension: Secondary | ICD-10-CM

## 2024-02-08 MED ORDER — CHLORTHALIDONE 25 MG PO TABS: 12.5000 mg | ORAL_TABLET | Freq: Every morning | ORAL | 3 refills | Status: AC

## 2024-02-08 MED ORDER — LISINOPRIL 10 MG PO TABS: 10.0000 mg | ORAL_TABLET | Freq: Every day | ORAL | 3 refills | Status: AC

## 2024-02-08 MED ORDER — CEFDINIR 300 MG PO CAPS
300.0000 mg | ORAL_CAPSULE | Freq: Two times a day (BID) | ORAL | 0 refills | Status: DC
Start: 2024-02-08 — End: 2024-05-12

## 2024-02-08 MED ORDER — PREDNISONE 50 MG PO TABS: ORAL_TABLET | ORAL | 0 refills | Status: DC

## 2024-02-08 NOTE — Assessment & Plan Note (Signed)
 Also with history of GERD typically well-controlled, having increasing upper abdominal discomfort worse when laying on his side, pain radiates to the back. Has not identified any trigger foods. 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 Will get abdominal ultrasound, labs. He will try to identify foods that are triggers.  He does have an appointment coming up with his gastroenterologist. Differential includes peptic ulcer disease, biliary colic, pancreatitis.

## 2024-02-08 NOTE — Assessment & Plan Note (Signed)
 Pleasant 58 year old male, has now had about 4 weeks of discomfort frontal sinuses multiple second sickening this. We will do 5 days of steroids, Omnicef , he will continue Flonase . If not better in 4 weeks we will consider CT maxillofacial.

## 2024-02-08 NOTE — Assessment & Plan Note (Signed)
 There are a couple of lesions on his scalp, right temple and left occiput that tend to recur when scratched. We did cryotherapy on both, return to see me in a month for this.

## 2024-02-08 NOTE — Progress Notes (Signed)
    Procedures performed today:    Procedure:  Cryodestruction of scalp lesion x 2 Consent obtained and verified. Time-out conducted. Noted no overlying erythema, induration, or other signs of local infection. Completed without difficulty using Cryo-Gun. Advised to call if fevers/chills, erythema, induration, drainage, or persistent bleeding.  Independent interpretation of notes and tests performed by another provider:   None.  Brief History, Exam, Impression, and Recommendations:    Acute bacterial sinusitis Pleasant 58 year old male, has now had about 4 weeks of discomfort frontal sinuses multiple second sickening this. We will do 5 days of steroids, Omnicef , he will continue Flonase . If not better in 4 weeks we will consider CT maxillofacial.  Upper abdominal pain Also with history of GERD typically well-controlled, having increasing upper abdominal discomfort worse when laying on his side, pain radiates to the back. Has not identified any trigger foods. 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 Will get abdominal ultrasound, labs. He will try to identify foods that are triggers.  He does have an appointment coming up with his gastroenterologist. Differential includes peptic ulcer disease, biliary colic, pancreatitis.  Scalp lesion There are a couple of lesions on his scalp, right temple and left occiput that tend to recur when scratched. We did cryotherapy on both, return to see me in a month for this.    ____________________________________________ Joselyn Nicely. Sandy Crumb, M.D., ABFM., CAQSM., AME. Primary Care and Sports Medicine [facility]  Adjunct Professor of [facility] Medicine  [employer]  Restaurant manager, fast food

## 2024-02-09 LAB — CBC WITH DIFFERENTIAL/PLATELET
Basophils Absolute: 0 10*3/uL (ref 0.0–0.2)
Basos: 1 %
EOS (ABSOLUTE): 0.2 10*3/uL (ref 0.0–0.4)
Eos: 4 %
Hematocrit: 47.2 % (ref 37.5–51.0)
Hemoglobin: 15.9 g/dL (ref 13.0–17.7)
Immature Grans (Abs): 0 10*3/uL (ref 0.0–0.1)
Immature Granulocytes: 0 %
Lymphocytes Absolute: 1.5 10*3/uL (ref 0.7–3.1)
Lymphs: 32 %
MCH: 32.3 pg (ref 26.6–33.0)
MCHC: 33.7 g/dL (ref 31.5–35.7)
MCV: 96 fL (ref 79–97)
Monocytes Absolute: 0.5 10*3/uL (ref 0.1–0.9)
Monocytes: 10 %
Neutrophils Absolute: 2.5 10*3/uL (ref 1.4–7.0)
Neutrophils: 53 %
Platelets: 217 10*3/uL (ref 150–450)
RBC: 4.93 x10E6/uL (ref 4.14–5.80)
RDW: 12.4 % (ref 11.6–15.4)
WBC: 4.8 10*3/uL (ref 3.4–10.8)

## 2024-02-09 LAB — COMPREHENSIVE METABOLIC PANEL WITH GFR
ALT: 24 IU/L (ref 0–44)
AST: 17 IU/L (ref 0–40)
Albumin: 4.8 g/dL (ref 3.8–4.9)
Alkaline Phosphatase: 71 IU/L (ref 44–121)
BUN/Creatinine Ratio: 20 (ref 9–20)
BUN: 19 mg/dL (ref 6–24)
Bilirubin Total: 0.7 mg/dL (ref 0.0–1.2)
CO2: 24 mmol/L (ref 20–29)
Calcium: 9.7 mg/dL (ref 8.7–10.2)
Chloride: 103 mmol/L (ref 96–106)
Creatinine, Ser: 0.93 mg/dL (ref 0.76–1.27)
Globulin, Total: 2.4 g/dL (ref 1.5–4.5)
Glucose: 86 mg/dL (ref 70–99)
Potassium: 4.4 mmol/L (ref 3.5–5.2)
Sodium: 141 mmol/L (ref 134–144)
Total Protein: 7.2 g/dL (ref 6.0–8.5)
eGFR: 97 mL/min/{1.73_m2} (ref >59)

## 2024-02-09 LAB — LIPASE: Lipase: 39 U/L (ref 13–78)

## 2024-02-21 ENCOUNTER — Ambulatory Visit: Payer: Self-pay | Admitting: *Deleted

## 2024-02-21 NOTE — Telephone Encounter (Signed)
 FYI Only or Action Required?: FYI only for provider  Patient was last seen in primary care on 02/08/2024 by Gean Keels, MD. Called Nurse Triage reporting Eye Problem. Symptoms began yesterday. Interventions attempted: Nothing. Symptoms are: unchanged.  Triage Disposition: See Physician Within 24 Hours  Patient/caregiver understands and will follow disposition?: Call to office and transferred to CAL for scheduling

## 2024-02-21 NOTE — Telephone Encounter (Signed)
  Reason for Disposition  Eyelid is red and painful (or tender to touch)  Answer Assessment - Initial Assessment Questions 1. EYE DISCHARGE: "Is the discharge in one or both eyes?" "What color is it?" "How much is there?" "When did the discharge start?"      Left eye- watery- started yesterday 2. REDNESS OF SCLERA: "Is the redness in one or both eyes?" "When did the redness start?"      *No Answer* 3. EYELIDS: "Are the eyelids red or swollen?" If Yes, ask: "How much?"      Swelling in corner of eye- at tear duct- pushing upper lid down 4. VISION: "Is there any difficulty seeing clearly?"      no 5. PAIN: "Is there any pain? If Yes, ask: "How bad is it?" (Scale 1-10; or mild, moderate, severe)    - MILD (1-3): doesn't interfere with normal activities     - MODERATE (4-7): interferes with normal activities or awakens from sleep    - SEVERE (8-10): excruciating pain, unable to do any normal activities       Tender to touch 6. CONTACT LENS: "Do you wear contacts?"     no 7. OTHER SYMPTOMS: "Do you have any other symptoms?" (e.g., fever, runny nose, cough)     no  Protocols used: Eye - Pus or Discharge-A-AH       Copied from CRM [id number]. Topic: Clinical - Red Word Triage >> Feb 21, 2024  9:04 AM Maryln Sober wrote: Red Word that prompted transfer to Nurse Triage: Patient has a swollen eye duct with redness and discharge.

## 2024-02-22 ENCOUNTER — Encounter: Payer: Self-pay | Admitting: Sports Medicine

## 2024-02-22 ENCOUNTER — Ambulatory Visit: Admitting: Sports Medicine

## 2024-02-22 VITALS — BP 121/68 | HR 90 | Resp 20 | Ht 72.0 in | Wt 221.0 lb

## 2024-02-22 DIAGNOSIS — L989 Disorder of the skin and subcutaneous tissue, unspecified: Secondary | ICD-10-CM

## 2024-02-22 DIAGNOSIS — H0014 Chalazion left upper eyelid: Secondary | ICD-10-CM | POA: Diagnosis not present

## 2024-02-22 MED ORDER — ERYTHROMYCIN 5 MG/GM OP OINT
1.0000 | TOPICAL_OINTMENT | Freq: Three times a day (TID) | OPHTHALMIC | 0 refills | Status: AC
Start: 2024-02-22 — End: 2024-02-27

## 2024-02-22 NOTE — Progress Notes (Signed)
    Procedures performed today:    None.  Independent interpretation of notes and tests performed by another provider:   None.  Brief History, Exam, Impression, and Recommendations:    Chalazion of left upper eyelid Swelling left upper eyelid, palpable nodule, appears to be a chalazion versus stye. No visual changes, no anterior chamber chemosis, no hyphema no proptosis. He is already doing warm compresses which have helped to some degree, adding topical erythromycin, return as needed.  Scalp lesion We performed cryotherapy on a couple of scalp lesions, right temple, left occiput, these have resolved.    ____________________________________________ Joselyn Nicely. Sandy Crumb, M.D., ABFM., CAQSM., AME. Primary Care and Sports Medicine [facility]  Adjunct Professor of [facility] Medicine  [employer]  Restaurant manager, fast food

## 2024-02-22 NOTE — Assessment & Plan Note (Signed)
 We performed cryotherapy on a couple of scalp lesions, right temple, left occiput, these have resolved.

## 2024-02-22 NOTE — Assessment & Plan Note (Signed)
 Swelling left upper eyelid, palpable nodule, appears to be a chalazion versus stye. No visual changes, no anterior chamber chemosis, no hyphema no proptosis. He is already doing warm compresses which have helped to some degree, adding topical erythromycin, return as needed.

## 2024-03-07 ENCOUNTER — Ambulatory Visit: Admitting: Sports Medicine

## 2024-03-18 DIAGNOSIS — R1010 Upper abdominal pain, unspecified: Secondary | ICD-10-CM | POA: Diagnosis not present

## 2024-03-18 DIAGNOSIS — R197 Diarrhea, unspecified: Secondary | ICD-10-CM | POA: Diagnosis not present

## 2024-03-18 DIAGNOSIS — R1013 Epigastric pain: Secondary | ICD-10-CM | POA: Diagnosis not present

## 2024-03-18 DIAGNOSIS — K921 Melena: Secondary | ICD-10-CM | POA: Diagnosis not present

## 2024-05-12 ENCOUNTER — Encounter (HOSPITAL_BASED_OUTPATIENT_CLINIC_OR_DEPARTMENT_OTHER): Payer: Self-pay | Admitting: Family

## 2024-05-12 ENCOUNTER — Ambulatory Visit (INDEPENDENT_AMBULATORY_CARE_PROVIDER_SITE_OTHER): Admitting: Family

## 2024-05-12 VITALS — BP 118/82 | HR 97 | Ht 72.0 in | Wt 222.0 lb

## 2024-05-12 DIAGNOSIS — I10 Essential (primary) hypertension: Secondary | ICD-10-CM | POA: Diagnosis not present

## 2024-05-12 DIAGNOSIS — R7989 Other specified abnormal findings of blood chemistry: Secondary | ICD-10-CM | POA: Diagnosis not present

## 2024-05-12 DIAGNOSIS — I493 Ventricular premature depolarization: Secondary | ICD-10-CM

## 2024-05-12 DIAGNOSIS — E069 Thyroiditis, unspecified: Secondary | ICD-10-CM | POA: Diagnosis not present

## 2024-05-12 LAB — LIPID PANEL
Chol/HDL Ratio: 5.8 ratio — ABNORMAL HIGH (ref 0.0–5.0)
Cholesterol, Total: 220 mg/dL — ABNORMAL HIGH (ref 100–199)
HDL: 38 mg/dL — ABNORMAL LOW (ref >39)
LDL Chol Calc (NIH): 150 mg/dL — ABNORMAL HIGH (ref 0–99)
Triglycerides: 173 mg/dL — ABNORMAL HIGH (ref 0–149)
VLDL Cholesterol Cal: 32 mg/dL (ref 5–40)

## 2024-05-12 NOTE — Progress Notes (Signed)
 Cardiology Office Note   Date:  05/12/2024  ID:  Casey Elliott, DOB [birth year], MRN [id number] PCP: Curtis Debby PARAS, MD  [facility] Providers Cardiologist:  Shelda Bruckner, MD     History of Present Illness Casey Elliott is a 58 y.o. male with hx of HTN, anxiety, palpitations, hypothyroidism.  Echo 01/2015 normal LVEF 55-60%, mild LVH, no significant valvular abnormalities. Stress echo 01/2021 (Care Everywhere) LVEF 55-60%, no ischemia. Monitor 09/2021 predominantly NSR, PVC burden 2%.   ED 10/23/21 with near syncope. K 2.9 which was repleted. ER visit 08/10/22 in immediate postoperative period of shoulder replacement. Symptoms felt due to pneumonia and treated with abx. Metoprolol previously Rx'd by PCP for palpitations.   Last seen by Dr. Bruckner 08/16/22 with daily episodes of SOB, chest pain, fatigue. Given recent surgery and pneumonia, decision made for monitoring. His palpitations were improved.   Presents today for follow up. Notes more PVC's onset 2-3 months ago which he attributes to stress. One episode did initially resolve with PRN Xanax . He is being worked up for fatty liver, he is upcoming colonoscopy and considering EGD. Reports sensation of needing to take a deep breath and feeling weird through his mid chest. This occurs separately from the PVCs. He is considering indigestion as an etiology. He does endorse he is out of shape but recently resumed exercise and is down 5 pounds. He actually felt better while exercising on the treadmill. Plans to start circuit and playing hockey. No orthopnea, PND.   ROS: Please see the history of present illness.    All other systems reviewed and are negative.   Studies Reviewed      Cardiac Studies & Procedures   ______________________________________________________________________________________________     ECHOCARDIOGRAM  ECHOCARDIOGRAM COMPLETE 02/03/2015  Narrative *[place]* *[facility]* [address] [place], [place] [address] [phone]  ------------------------------------------------------------------- Transthoracic Echocardiography  Patient:    Casey Elliott, Casey Elliott MR #:       [id number] Study Date: 02/03/2015 Gender:     M Age:        48 Height:     182.9 cm Weight:     99.8 kg BSA:        2.27 m^2 Pt. Status: Room:  ATTENDING    Hessie Mallick [id number]     Ynffzo, Mallick [id number] BARTON Hessie Mallick [id number] PERFORMING   [facility], Outpatient SONOGRAPHER  Ellouise Mose  cc:  ------------------------------------------------------------------- LV EF: 55% -   60%  ------------------------------------------------------------------- Indications:      Dyspnea 786.09.  Palpitations 785.1.  ------------------------------------------------------------------- Study Conclusions  - Left ventricle: The cavity size was normal. Wall thickness was increased in a pattern of mild LVH. Systolic function was normal. The estimated ejection fraction was in the range of 55% to 60%. Wall motion was normal; there were no regional wall motion abnormalities. Left ventricular diastolic function parameters were normal. - Left atrium: The atrium was mildly dilated.  Transthoracic echocardiography.  M-mode, complete 2D, spectral Doppler, and color Doppler.  Birthdate:  Patient birthdate: May 27, 1966.  Age:  Patient is 58 yr old.  Sex:  Gender: male. BMI: 29.8 kg/m^2.  Blood pressure:     144/77  Patient status: Inpatient.  Study date:  Study date: 02/03/2015. Study time: 03:03 PM.  Location:  Echo laboratory.  -------------------------------------------------------------------  ------------------------------------------------------------------- Left ventricle:  The cavity size was normal. Wall thickness was increased in a pattern of mild LVH. Systolic function was normal. The estimated ejection fraction was  in the range of 55% to 60%. Wall motion was  normal; there were no regional wall motion abnormalities. The transmitral flow pattern was normal. The deceleration time of the early transmitral flow velocity was normal. The pulmonary vein flow pattern was normal. The tissue Doppler parameters were normal. Left ventricular diastolic function parameters were normal.  ------------------------------------------------------------------- Aortic valve:   Trileaflet; normal thickness leaflets. Mobility was not restricted.  Doppler:  Transvalvular velocity was within the normal range. There was no stenosis. There was no regurgitation.  ------------------------------------------------------------------- Aorta:  Aortic root: The aortic root was normal in size.  ------------------------------------------------------------------- Mitral valve:   Structurally normal valve.   Mobility was not restricted.  Doppler:  Transvalvular velocity was within the normal range. There was no evidence for stenosis. There was no regurgitation.  ------------------------------------------------------------------- Left atrium:  The atrium was mildly dilated.  ------------------------------------------------------------------- Right ventricle:  The cavity size was normal. Wall thickness was normal. Systolic function was normal.  ------------------------------------------------------------------- Pulmonic valve:    Doppler:  Transvalvular velocity was within the normal range. There was no evidence for stenosis.  ------------------------------------------------------------------- Tricuspid valve:   Structurally normal valve.    Doppler: Transvalvular velocity was within the normal range. There was no regurgitation.  ------------------------------------------------------------------- Pulmonary artery:   The main pulmonary artery was normal-sized. Systolic pressure was within the normal  range.  ------------------------------------------------------------------- Right atrium:  The atrium was normal in size.  ------------------------------------------------------------------- Pericardium:  There was no pericardial effusion.  ------------------------------------------------------------------- Systemic veins: Inferior vena cava: The vessel was normal in size.  ------------------------------------------------------------------- Measurements  Left ventricle                           Value        Reference LV ID, ED, PLAX chordal                  43.3  mm     43 - 52 LV ID, ES, PLAX chordal                  28.9  mm     23 - 38 LV fx shortening, PLAX chordal           33    %      >=29 LV PW thickness, ED                      13.8  mm     --------- IVS/LV PW ratio, ED                      0.96         <=1.3 Stroke volume, 2D                        68    ml     --------- Stroke volume/bsa, 2D                    30    ml/m^2 --------- LV ejection fraction, 1-p A4C            49    %      --------- LV end-diastolic volume, 2-p             128   ml     --------- LV end-systolic volume, 2-p              55    ml     ---------  LV ejection fraction, 2-p                57    %      --------- Stroke volume, 2-p                       73    ml     --------- LV end-diastolic volume/bsa, 2-p         56    ml/m^2 --------- LV end-systolic volume/bsa, 2-p          24    ml/m^2 --------- Stroke volume/bsa, 2-p                   32.1  ml/m^2 --------- LV e&', lateral                           14.4  cm/s   --------- LV E/e&', lateral                         4.6          --------- LV e&', medial                            8.48  cm/s   --------- LV E/e&', medial                          7.81         --------- LV e&', average                           11.44 cm/s   --------- LV E/e&', average                         5.79         ---------  Ventricular septum                       Value         Reference IVS thickness, ED                        13.2  mm     ---------  LVOT                                     Value        Reference LVOT ID, S                               22    mm     --------- LVOT area                                3.8   cm^2   --------- LVOT peak velocity, S                    98.2  cm/s   --------- LVOT mean velocity, S                    64.7  cm/s   ---------  LVOT VTI, S                              18    cm     ---------  Aorta                                    Value        Reference Aortic root ID, ED                       39    mm     ---------  Left atrium                              Value        Reference LA ID, A-P, ES                           41    mm     --------- LA ID/bsa, A-P                           1.8   cm/m^2 <=2.2 LA volume, S                             70    ml     --------- LA volume/bsa, S                         30.8  ml/m^2 --------- LA volume, ES, 1-p A4C                   74    ml     --------- LA volume/bsa, ES, 1-p A4C               32.5  ml/m^2 --------- LA volume, ES, 1-p A2C                   59    ml     --------- LA volume/bsa, ES, 1-p A2C               25.9  ml/m^2 ---------  Mitral valve                             Value        Reference Mitral E-wave peak velocity              66.2  cm/s   --------- Mitral A-wave peak velocity              76.8  cm/s   --------- Mitral deceleration time         (H)     232   ms     150 - 230 Mitral E/A ratio, peak                   0.9          ---------  Pulmonary arteries                       Value  Reference PA pressure, S, DP                       25    mm Hg  <=30  Tricuspid valve                          Value        Reference Tricuspid regurg peak velocity           233   cm/s   --------- Tricuspid peak RV-RA gradient            22    mm Hg  ---------  Systemic veins                           Value        Reference Estimated CVP                            3      mm Hg  ---------  Right ventricle                          Value        Reference RV pressure, S, DP                       25    mm Hg  <=30 RV s&', lateral, S                        17.2  cm/s   ---------  Pulmonic valve                           Value        Reference Pulmonic valve peak velocity, S          145   cm/s   ---------  Legend: (L)  and  (H)  mark values outside specified reference range.  ------------------------------------------------------------------- Prepared and Electronically Authenticated by  Jerel Balding, MD 2016-05-18T22:12:17    MONITORS  LONG TERM MONITOR (3-14 DAYS) 10/11/2021  Narrative Patch Wear Time:  7 days and 2 hours (2023-01-08T12:02:57-0500 to 2023-01-15T14:06:49-0500)  Patient had a min HR of 45 bpm, max HR of 148 bpm, and avg HR of 86 bpm. Predominant underlying rhythm was Sinus Rhythm. Isolated SVEs were rare (<1.0%), and no SVE Couplets or SVE Triplets were present. Isolated VEs were occasional (2.0%, 17749), VE Couplets were rare (<1.0%, 12), and no VE Triplets were present. Ventricular Bigeminy and Trigeminy were present.  Maude Emmer MD [facility]       ______________________________________________________________________________________________      Risk Assessment/Calculations           Physical Exam VS:  BP 118/82   Pulse 97   Ht 6' (1.829 m)   Wt 222 lb (100.7 kg)   BMI 30.11 kg/m        Wt Readings from Last 3 Encounters:  05/12/24 222 lb (100.7 kg)  02/22/24 221 lb (100.2 kg)  02/08/24 219 lb (99.3 kg)    GEN: Well nourished, well developed in no acute distress NECK: No JVD; No carotid bruits CARDIAC: RRR, no murmurs, rubs, gallops RESPIRATORY:  Clear to auscultation without rales, wheezing or rhonchi  ABDOMEN: Soft, non-tender, non-distended EXTREMITIES:  No edema; No deformity   ASSESSMENT AND PLAN  HTN - BP well controlled in clinic and at home. Continue current antihypertensive regimen Lisinopril  10mg   daily, Chlorthalidone  12.5mg  daily. Home SBP range 106-136.  Recommend aiming for 150 minutes of moderate intensity activity per week and following a heart healthy diet.    PVC / palpitations - EKG today NSR with no acute ST/T wave changes. Reports episodes of palpitations related to stress. Discussed a PRN Diltiazem if palpitations are more bothersome in the future as he did not tolerate beta blocker previously. Recommend limit caffeine/etoh, manage stress well, stay well hydrated.  HLD - not on lipid lowering agent. Update lipid panel. Prefers to avoid medications if possible.       Dispo: follow up in 1 year with Dr. Lonni or APP  Signed, Reche GORMAN Finder, NP

## 2024-05-12 NOTE — Patient Instructions (Addendum)
 Medication Instructions:   Your physician recommends that you continue on your current medications as directed. Please refer to the Current Medication list given to you today.   *If you need a refill on your cardiac medications before your next appointment, please call your pharmacy*  Lab Work:  TODAY!!! LIPID  If you have labs (blood work) drawn today and your tests are completely normal, you will receive your results only by: MyChart Message (if you have MyChart) OR A paper copy in the mail If you have any lab test that is abnormal or we need to change your treatment, we will call you to review the results.  Testing/Procedures:  None ordered.  Follow-Up: At [facility], you and your health needs are our priority.  As part of our continuing mission to provide you with exceptional heart care, our providers are all part of one team.  This team includes your primary Cardiologist (physician) and Advanced Practice Providers or APPs (Physician Assistants and Nurse Practitioners) who all work together to provide you with the care you need, when you need it.  Your next appointment:   1 year(s)  Provider:   Shelda Bruckner, MD, Rosaline Bane, NP, or Reche Finder, NP    We recommend signing up for the patient portal called MyChart.  Sign up information is provided on this After Visit Summary.  MyChart is used to connect with patients for Virtual Visits (Telemedicine).  Patients are able to view lab/test results, encounter notes, upcoming appointments, etc.  Non-urgent messages can be sent to your provider as well.   To learn more about what you can do with MyChart, go to [email or web address].   Other Instructions  Your physician wants you to follow-up in: 1 year.  You will receive a reminder letter in the mail two months in advance. If you don't receive a letter, please call our office to schedule the follow-up appointment.      To prevent palpitations: Make  sure you are adequately hydrated.  Avoid and/or limit caffeine containing beverages like soda or tea. Exercise regularly.  Manage stress well. Some over the counter medications can cause palpitations such as Benadryl, AdvilPM, TylenolPM. Regular Advil or Tylenol do not cause palpitations.

## 2024-05-13 ENCOUNTER — Ambulatory Visit (HOSPITAL_BASED_OUTPATIENT_CLINIC_OR_DEPARTMENT_OTHER): Payer: Self-pay | Admitting: Family

## 2024-05-20 ENCOUNTER — Encounter: Payer: Self-pay | Admitting: Sports Medicine

## 2024-05-21 ENCOUNTER — Encounter: Payer: Self-pay | Admitting: Dermatology

## 2024-05-21 ENCOUNTER — Ambulatory Visit (INDEPENDENT_AMBULATORY_CARE_PROVIDER_SITE_OTHER): Admitting: Dermatology

## 2024-05-21 DIAGNOSIS — L821 Other seborrheic keratosis: Secondary | ICD-10-CM

## 2024-05-21 DIAGNOSIS — D492 Neoplasm of unspecified behavior of bone, soft tissue, and skin: Secondary | ICD-10-CM | POA: Diagnosis not present

## 2024-05-21 DIAGNOSIS — D485 Neoplasm of uncertain behavior of skin: Secondary | ICD-10-CM

## 2024-05-21 DIAGNOSIS — L578 Other skin changes due to chronic exposure to nonionizing radiation: Secondary | ICD-10-CM | POA: Diagnosis not present

## 2024-05-21 DIAGNOSIS — D229 Melanocytic nevi, unspecified: Secondary | ICD-10-CM

## 2024-05-21 DIAGNOSIS — L739 Follicular disorder, unspecified: Secondary | ICD-10-CM

## 2024-05-21 DIAGNOSIS — Z1283 Encounter for screening for malignant neoplasm of skin: Secondary | ICD-10-CM

## 2024-05-21 DIAGNOSIS — L988 Other specified disorders of the skin and subcutaneous tissue: Secondary | ICD-10-CM

## 2024-05-21 DIAGNOSIS — D1801 Hemangioma of skin and subcutaneous tissue: Secondary | ICD-10-CM

## 2024-05-21 DIAGNOSIS — L814 Other melanin hyperpigmentation: Secondary | ICD-10-CM

## 2024-05-21 DIAGNOSIS — W908XXA Exposure to other nonionizing radiation, initial encounter: Secondary | ICD-10-CM

## 2024-05-21 MED ORDER — CLINDAMYCIN PHOSPHATE 1 % EX SOLN: Freq: Two times a day (BID) | CUTANEOUS | 5 refills | Status: AC

## 2024-05-21 NOTE — Patient Instructions (Addendum)

## 2024-05-21 NOTE — Progress Notes (Signed)
 New Patient Visit   Subjective  Casey Elliott is a 58 y.o. male who presents for the following: Skin Cancer Screening and Full Body Skin Exam  The patient presents for Total-Body Skin Exam (TBSE) for skin cancer screening and mole check. The patient has spots, moles and lesions to be evaluated, some may be new or changing.  Denies personal and family history of skin cancer. Patient has never seen a dermatologist. Patient has had areas frozen by his PCP.   The following portions of the chart were reviewed this encounter and updated as appropriate: medications, allergies, medical history  Review of Systems:  No other skin or systemic complaints except as noted in HPI or Assessment and Plan.  Objective  Well appearing patient in no apparent distress; mood and affect are within normal limits.  A full examination was performed including scalp, head, eyes, ears, nose, lips, neck, chest, axillae, abdomen, back, buttocks, bilateral upper extremities, bilateral lower extremities, hands, feet, fingers, toes, fingernails, and toenails. All findings within normal limits unless otherwise noted below.   Relevant physical exam findings are noted in the Assessment and Plan.  Right Anterior Shoulder 5mm dome shaped papule with central punctum   Assessment & Plan   SKIN CANCER SCREENING PERFORMED TODAY.  ACTINIC DAMAGE - Chronic condition, secondary to cumulative UV/sun exposure - diffuse scaly erythematous macules with underlying dyspigmentation - Recommend daily broad spectrum sunscreen SPF 30+ to sun-exposed areas, reapply every 2 hours as needed.  - Staying in the shade or wearing long sleeves, sun glasses (UVA+UVB protection) and wide brim hats (4-inch brim around the entire circumference of the hat) are also recommended for sun protection.  - Call for new or changing lesions.  LENTIGINES, SEBORRHEIC KERATOSES, HEMANGIOMAS - Benign normal skin lesions - Benign-appearing - Call for any  changes  MELANOCYTIC NEVI - Tan-brown and/or pink-flesh-colored symmetric macules and papules - Benign appearing on exam today - Observation - Call clinic for new or changing moles - Recommend daily use of broad spectrum spf 30+ sunscreen to sun-exposed areas.   FOLLICULITIS Exam: Perifollicular erythematous papules and pustules  Folliculitis occurs due to inflammation of the superficial hair follicle (pore), resulting in acne-like lesions (pus bumps). It can be infectious (bacterial, fungal) or noninfectious (shaving, tight clothing, heat/sweat, medications).  Folliculitis can be acute or chronic and recommended treatment depends on the underlying cause of folliculitis.  Treatment Plan: Clindamycin  1%  external solution Hibiclens Wash 3x a week  FOLLICULITIS   Related Medications clindamycin  (CLEOCIN  T) 1 % external solution Apply topically 2 (two) times daily. NEOPLASM OF UNCERTAIN BEHAVIOR OF SKIN Right Anterior Shoulder Skin / nail biopsy Type of biopsy: tangential   Informed consent: discussed and consent obtained   Timeout: patient name, date of birth, surgical site, and procedure verified   Procedure prep:  Patient was prepped and draped in usual sterile fashion Prep type:  Isopropyl alcohol Anesthesia: the lesion was anesthetized in a standard fashion   Anesthetic:  1% lidocaine w/ epinephrine 1-100,000 buffered w/ 8.4% NaHCO3 Instrument used: DermaBlade   Hemostasis achieved with: aluminum chloride   Outcome: patient tolerated procedure well   Post-procedure details: sterile dressing applied and wound care instructions given   Dressing type: bandage and petrolatum    Specimen 1 - Surgical pathology Differential Diagnosis: r/o NMSC vs other  Check Margins: No ACTINIC SKIN DAMAGE   LENTIGINES   SEBORRHEIC KERATOSIS   CHERRY ANGIOMA   MULTIPLE BENIGN NEVI    Return in about  2 years (around 05/21/2026) for FBSE.  LILLETTE Rollene Gobble, RN, am acting as  scribe for RUFUS CHRISTELLA HOLY, MD .   Documentation: I have reviewed the above documentation for accuracy and completeness, and I agree with the above.  RUFUS CHRISTELLA HOLY, MD

## 2024-05-23 DIAGNOSIS — E069 Thyroiditis, unspecified: Secondary | ICD-10-CM | POA: Diagnosis not present

## 2024-05-23 DIAGNOSIS — R7989 Other specified abnormal findings of blood chemistry: Secondary | ICD-10-CM | POA: Diagnosis not present

## 2024-05-23 LAB — SURGICAL PATHOLOGY

## 2024-05-25 ENCOUNTER — Ambulatory Visit: Payer: Self-pay | Admitting: Dermatology

## 2024-05-26 DIAGNOSIS — Z860101 Personal history of adenomatous and serrated colon polyps: Secondary | ICD-10-CM | POA: Diagnosis not present

## 2024-05-26 DIAGNOSIS — Z1211 Encounter for screening for malignant neoplasm of colon: Secondary | ICD-10-CM | POA: Diagnosis not present

## 2024-05-26 DIAGNOSIS — Z09 Encounter for follow-up examination after completed treatment for conditions other than malignant neoplasm: Secondary | ICD-10-CM | POA: Diagnosis not present

## 2024-05-26 DIAGNOSIS — K644 Residual hemorrhoidal skin tags: Secondary | ICD-10-CM | POA: Diagnosis not present

## 2024-05-26 DIAGNOSIS — K5730 Diverticulosis of large intestine without perforation or abscess without bleeding: Secondary | ICD-10-CM | POA: Diagnosis not present

## 2024-07-14 ENCOUNTER — Ambulatory Visit (HOSPITAL_BASED_OUTPATIENT_CLINIC_OR_DEPARTMENT_OTHER): Admitting: Family
